# Patient Record
Sex: Female | Born: 1990 | Hispanic: Yes | Marital: Married | Smoking: Never smoker
Health system: Southern US, Community
[De-identification: ages and names within clinical notes are randomized; demographics above are authoritative.]

## PROBLEM LIST (undated history)

## (undated) ENCOUNTER — Inpatient Hospital Stay (HOSPITAL_COMMUNITY): Payer: Self-pay

## (undated) DIAGNOSIS — Z789 Other specified health status: Secondary | ICD-10-CM

---
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
## 2011-01-08 ENCOUNTER — Other Ambulatory Visit: Payer: Self-pay | Admitting: Family Medicine

## 2011-01-08 DIAGNOSIS — Z3689 Encounter for other specified antenatal screening: Secondary | ICD-10-CM

## 2011-01-08 LAB — ABO/RH: RH Type: POSITIVE

## 2011-01-08 LAB — HEPATITIS B SURFACE ANTIGEN: Hepatitis B Surface Ag: NEGATIVE

## 2011-01-08 LAB — HIV ANTIBODY (ROUTINE TESTING W REFLEX): HIV: NONREACTIVE

## 2011-02-04 ENCOUNTER — Ambulatory Visit (HOSPITAL_COMMUNITY)
Admission: RE | Admit: 2011-02-04 | Discharge: 2011-02-04 | Disposition: A | Discharge status: Home / Self Care | Payer: Medicaid Other | Source: Ambulatory Visit | Attending: Family Medicine | Admitting: Family Medicine

## 2011-02-04 DIAGNOSIS — Z3689 Encounter for other specified antenatal screening: Secondary | ICD-10-CM

## 2011-02-04 DIAGNOSIS — Z363 Encounter for antenatal screening for malformations: Secondary | ICD-10-CM | POA: Insufficient documentation

## 2011-02-04 DIAGNOSIS — O358XX0 Maternal care for other (suspected) fetal abnormality and damage, not applicable or unspecified: Secondary | ICD-10-CM | POA: Insufficient documentation

## 2011-02-04 DIAGNOSIS — Z1389 Encounter for screening for other disorder: Secondary | ICD-10-CM | POA: Insufficient documentation

## 2011-02-05 ENCOUNTER — Other Ambulatory Visit: Payer: Self-pay | Admitting: Family Medicine

## 2011-02-05 DIAGNOSIS — Z3689 Encounter for other specified antenatal screening: Secondary | ICD-10-CM

## 2011-02-18 ENCOUNTER — Ambulatory Visit (HOSPITAL_COMMUNITY)
Admission: RE | Admit: 2011-02-18 | Discharge: 2011-02-18 | Disposition: A | Discharge status: Home / Self Care | Payer: Medicaid Other | Source: Ambulatory Visit | Attending: Family Medicine | Admitting: Family Medicine

## 2011-02-18 DIAGNOSIS — Z3689 Encounter for other specified antenatal screening: Secondary | ICD-10-CM | POA: Insufficient documentation

## 2011-04-28 ENCOUNTER — Other Ambulatory Visit (HOSPITAL_COMMUNITY): Payer: Self-pay | Admitting: Physician Assistant

## 2011-04-28 ENCOUNTER — Ambulatory Visit (HOSPITAL_COMMUNITY)
Admission: RE | Admit: 2011-04-28 | Discharge: 2011-04-28 | Disposition: A | Discharge status: Home / Self Care | Payer: Medicaid Other | Source: Ambulatory Visit | Attending: Physician Assistant | Admitting: Physician Assistant

## 2011-04-28 DIAGNOSIS — O368190 Decreased fetal movements, unspecified trimester, not applicable or unspecified: Secondary | ICD-10-CM

## 2011-04-28 DIAGNOSIS — O365990 Maternal care for other known or suspected poor fetal growth, unspecified trimester, not applicable or unspecified: Secondary | ICD-10-CM | POA: Insufficient documentation

## 2011-04-28 DIAGNOSIS — Z3689 Encounter for other specified antenatal screening: Secondary | ICD-10-CM | POA: Insufficient documentation

## 2011-07-07 ENCOUNTER — Other Ambulatory Visit (HOSPITAL_COMMUNITY): Payer: Self-pay | Admitting: Nurse Practitioner

## 2011-07-07 DIAGNOSIS — O480 Post-term pregnancy: Secondary | ICD-10-CM

## 2011-07-09 ENCOUNTER — Telehealth (HOSPITAL_COMMUNITY): Payer: Self-pay | Admitting: *Deleted

## 2011-07-09 ENCOUNTER — Encounter (HOSPITAL_COMMUNITY): Payer: Self-pay | Admitting: *Deleted

## 2011-07-09 NOTE — Telephone Encounter (Signed)
Preadmission screen  

## 2011-07-10 ENCOUNTER — Other Ambulatory Visit (HOSPITAL_COMMUNITY): Payer: Self-pay | Admitting: Nurse Practitioner

## 2011-07-10 ENCOUNTER — Inpatient Hospital Stay (HOSPITAL_COMMUNITY)
Admission: AD | Admit: 2011-07-10 | Discharge: 2011-07-15 | DRG: 766 | Disposition: A | Discharge status: Home / Self Care | Payer: Medicaid Other | Attending: Obstetrics & Gynecology | Admitting: Obstetrics & Gynecology

## 2011-07-10 ENCOUNTER — Encounter (HOSPITAL_COMMUNITY): Payer: Self-pay | Admitting: *Deleted

## 2011-07-10 ENCOUNTER — Ambulatory Visit (HOSPITAL_COMMUNITY)
Admission: RE | Admit: 2011-07-10 | Discharge: 2011-07-10 | Disposition: A | Discharge status: Home / Self Care | Payer: Self-pay | Source: Ambulatory Visit | Attending: Nurse Practitioner | Admitting: Nurse Practitioner

## 2011-07-10 DIAGNOSIS — Z98891 History of uterine scar from previous surgery: Secondary | ICD-10-CM

## 2011-07-10 DIAGNOSIS — O480 Post-term pregnancy: Secondary | ICD-10-CM

## 2011-07-10 DIAGNOSIS — O324XX0 Maternal care for high head at term, not applicable or unspecified: Secondary | ICD-10-CM | POA: Diagnosis present

## 2011-07-10 DIAGNOSIS — Z3689 Encounter for other specified antenatal screening: Secondary | ICD-10-CM | POA: Insufficient documentation

## 2011-07-10 NOTE — MAU Note (Signed)
Contractions every 5-6 minutes since 9pm. No vaginal bleeding. Good fetal movement

## 2011-07-11 ENCOUNTER — Encounter (HOSPITAL_COMMUNITY): Payer: Self-pay | Admitting: *Deleted

## 2011-07-11 LAB — CBC
HCT: 37.6 % (ref 36.0–46.0)
Hemoglobin: 12.5 g/dL (ref 12.0–15.0)
MCH: 29.8 pg (ref 26.0–34.0)
MCV: 89.5 fL (ref 78.0–100.0)
Platelets: 174 10*3/uL (ref 150–400)
RBC: 4.2 MIL/uL (ref 3.87–5.11)
WBC: 12.8 10*3/uL — ABNORMAL HIGH (ref 4.0–10.5)

## 2011-07-11 MED ORDER — OXYCODONE-ACETAMINOPHEN 5-325 MG PO TABS: 1.0000 | ORAL_TABLET | ORAL | Status: DC | PRN

## 2011-07-11 MED ORDER — DIPHENHYDRAMINE HCL 50 MG/ML IJ SOLN
12.5000 mg | INTRAMUSCULAR | Status: DC | PRN
  Filled 2011-07-11: qty 1

## 2011-07-11 MED ORDER — FENTANYL 2.5 MCG/ML BUPIVACAINE 1/10 % EPIDURAL INFUSION (WH - ANES)
14.0000 mL/h | INTRAMUSCULAR | Status: DC
  Administered 2011-07-11 (×5): 14 mL/h via EPIDURAL
  Filled 2011-07-11 (×6): qty 60

## 2011-07-11 MED ORDER — OXYTOCIN BOLUS FROM INFUSION
500.0000 mL | Freq: Once | INTRAVENOUS | Status: DC
  Filled 2011-07-11: qty 500

## 2011-07-11 MED ORDER — LIDOCAINE HCL (PF) 1 % IJ SOLN
30.0000 mL | INTRAMUSCULAR | Status: DC | PRN
  Filled 2011-07-11: qty 30

## 2011-07-11 MED ORDER — OXYTOCIN 20 UNITS IN LACTATED RINGERS INFUSION - SIMPLE
1.0000 m[IU]/min | INTRAVENOUS | Status: DC
  Administered 2011-07-11: 2 m[IU]/min via INTRAVENOUS
  Filled 2011-07-11: qty 1000

## 2011-07-11 MED ORDER — LACTATED RINGERS IV SOLN
INTRAVENOUS | Status: DC
  Administered 2011-07-11: 125 mL/h via INTRAVENOUS
  Administered 2011-07-11 (×3): via INTRAVENOUS

## 2011-07-11 MED ORDER — PHENYLEPHRINE 40 MCG/ML (10ML) SYRINGE FOR IV PUSH (FOR BLOOD PRESSURE SUPPORT)
80.0000 ug | PREFILLED_SYRINGE | INTRAVENOUS | Status: DC | PRN
  Filled 2011-07-11 (×2): qty 5

## 2011-07-11 MED ORDER — IBUPROFEN 600 MG PO TABS: 600.0000 mg | ORAL_TABLET | Freq: Four times a day (QID) | ORAL | Status: DC | PRN

## 2011-07-11 MED ORDER — SODIUM BICARBONATE 8.4 % IV SOLN
INTRAVENOUS | Status: DC | PRN
  Administered 2011-07-11: 4 mL via EPIDURAL

## 2011-07-11 MED ORDER — PHENYLEPHRINE 40 MCG/ML (10ML) SYRINGE FOR IV PUSH (FOR BLOOD PRESSURE SUPPORT): 80.0000 ug | PREFILLED_SYRINGE | INTRAVENOUS | Status: DC | PRN

## 2011-07-11 MED ORDER — LACTATED RINGERS IV SOLN
500.0000 mL | INTRAVENOUS | Status: DC | PRN
  Administered 2011-07-11 (×2): 500 mL via INTRAVENOUS

## 2011-07-11 MED ORDER — CITRIC ACID-SODIUM CITRATE 334-500 MG/5ML PO SOLN
30.0000 mL | ORAL | Status: DC | PRN
  Administered 2011-07-12: 30 mL via ORAL
  Filled 2011-07-11: qty 15

## 2011-07-11 MED ORDER — NALBUPHINE SYRINGE 5 MG/0.5 ML: 5.0000 mg | INJECTION | INTRAMUSCULAR | Status: DC | PRN

## 2011-07-11 MED ORDER — EPHEDRINE 5 MG/ML INJ: 10.0000 mg | INTRAVENOUS | Status: DC | PRN

## 2011-07-11 MED ORDER — LACTATED RINGERS IV SOLN: 500.0000 mL | Freq: Once | INTRAVENOUS | Status: DC

## 2011-07-11 MED ORDER — TERBUTALINE SULFATE 1 MG/ML IJ SOLN: 0.2500 mg | Freq: Once | INTRAMUSCULAR | Status: AC | PRN

## 2011-07-11 MED ORDER — FENTANYL 2.5 MCG/ML BUPIVACAINE 1/10 % EPIDURAL INFUSION (WH - ANES)
INTRAMUSCULAR | Status: DC | PRN
  Administered 2011-07-11: 12 mL/h via EPIDURAL

## 2011-07-11 MED ORDER — ONDANSETRON HCL 4 MG/2ML IJ SOLN: 4.0000 mg | Freq: Four times a day (QID) | INTRAMUSCULAR | Status: DC | PRN

## 2011-07-11 MED ORDER — OXYTOCIN 20 UNITS IN LACTATED RINGERS INFUSION - SIMPLE: 125.0000 mL/h | Freq: Once | INTRAVENOUS | Status: DC

## 2011-07-11 MED ORDER — FLEET ENEMA 7-19 GM/118ML RE ENEM: 1.0000 | ENEMA | RECTAL | Status: DC | PRN

## 2011-07-11 MED ORDER — ACETAMINOPHEN 325 MG PO TABS: 650.0000 mg | ORAL_TABLET | ORAL | Status: DC | PRN

## 2011-07-11 MED ORDER — EPHEDRINE 5 MG/ML INJ
10.0000 mg | INTRAVENOUS | Status: DC | PRN
  Filled 2011-07-11 (×2): qty 4

## 2011-07-11 NOTE — MAU Provider Note (Signed)
  History     Chief Complaint  Patient presents with  . Contractions   HPI Patient is a 21 yo G1 at [redacted]w[redacted]d presenting for contractions that started 3 hours ago. She has not had any LOF. Reports good fetal movement. Some bloody show noted. [facility] States no complications during this pregnancy.  OB History    Grav Para Term Preterm Abortions TAB SAB Ect Mult Living   1               Past Medical History  Diagnosis Date  . No pertinent past medical history     Past Surgical History  Procedure Date  . No past surgeries     History reviewed. No pertinent family history.  History  Substance Use Topics  . Smoking status: Never Smoker   . Smokeless tobacco: Never Used  . Alcohol Use: No    Allergies: No Known Allergies  Prescriptions prior to admission  Medication Sig Dispense Refill  . prenatal vitamin w/FE, FA (PRENATAL 1 + 1) 27-1 MG TABS Take 1 tablet by mouth daily.          Review of Systems  Constitutional: Negative for fever and chills.  Respiratory: Negative for shortness of breath.   Cardiovascular: Negative for chest pain.  Gastrointestinal: Negative for nausea and vomiting.  Genitourinary: Negative for dysuria.  Skin: Negative for rash.  Neurological: Negative for dizziness and headaches.   Physical Exam   Blood pressure 120/81, pulse 83, temperature 97.9 F (36.6 C), temperature source Oral, resp. rate 16, height 5' 3.5" (1.613 m), weight 84.823 kg (187 lb), last menstrual period 09/30/2010.  Physical Exam  Constitutional: She is oriented to person, place, and time. She appears well-developed and well-nourished. No distress.  HENT:  Head: Normocephalic and atraumatic.  Neck: Normal range of motion.  Cardiovascular: Normal rate and regular rhythm.   No murmur heard. Respiratory: Effort normal and breath sounds normal. She has no wheezes.  GI: Soft.       Gravid. Toco in place.  Musculoskeletal: Normal range of  motion. She exhibits no edema and no tenderness.  Neurological: She is alert and oriented to person, place, and time. No cranial nerve deficit.  Skin: Skin is dry. No rash noted.    MAU Course  Procedures Dilation: 3.5 Effacement (%): 60 Station: -2 Presentation: Vertex Exam by:: L. Munford RN  MDM Irregular contracting every 4-6 min. FHT reassuring with a baseline in the 140's, moderate variability. Patient dilated to 3.5. Patient states she would like to walk for one hour and be rechecked.  Recheck  Dilation: 4.5 Effacement (%): 60 Station: -2 Presentation: Vertex Exam by:: L. Munford RN  Assessment and Plan  21 yo G1P0 at [redacted]w[redacted]d presenting for contractions - Patient with cervical change after observation. Will admit to L&D. Plan discussed with Maylon Cos CNM who agrees with plan.  Ki Luckman 07/11/2011, 12:02 AM

## 2011-07-11 NOTE — Progress Notes (Signed)
Leslie Jackson is a 21 y.o. G1P0000 at [redacted]w[redacted]d by LMP admitted for active labor  Subjective: Patient is doing well.  She c/o itchiness and tingling in legs after epidural.  She feels slightly drowsy now.  She is not feeling contractions and denies HA and visual changes.  Objective: BP 97/63  Pulse 93  Temp(Src) 98.2 F (36.8 C) (Oral)  Resp 20  Ht 5\' 4"  (1.626 m)  Wt 82.555 kg (182 lb)  BMI 31.24 kg/m2  SpO2 98%  LMP 09/30/2010      FHT:  FHR: 130 bpm, variability: moderate,  accelerations:  Present,  decelerations:  Absent variables UC:   regular, every 2-3 minutes SVE:   Dilation: 6 Effacement (%): 70;80 Station: -2 Exam by:: Lorretta Harp RNC  Labs: Lab Results  Component Value Date   WBC 12.8* 07/11/2011   HGB 12.5 07/11/2011   HCT 37.6 07/11/2011   MCV 89.5 07/11/2011   PLT 174 07/11/2011    Assessment / Plan: Spontaneous labor, progressing normally  Labor: Progressing on Pitocin Preeclampsia:  no signs or symptoms of toxicity Fetal Wellbeing:  Category I Pain Control:  Epidural I/D:  n/a Anticipated MOD:  NSVD  Mardene Speak 07/11/2011, 12:12 PM  Patient seen and evaluated with PA student. Will consider IUPC at next check  Fawn Kirk 07/11/2011, 12:57PM

## 2011-07-11 NOTE — H&P (Signed)
Leslie Jackson is a 21 y.o. female G1P0 at [redacted]w[redacted]d presenting for active labor. Patient was seen in MAU. Cervix dilated to 3.5 cm on admission. Patient observed for one hour, dilated to 4.5 therefore admitted to L&D. Patient deines LOF or vaginal bleeding. Good fetal movement. Prenatal care at [facility] Department. No complications of pregnancy. Interested in breast feeding. Pediatrician will be [facility]. Patient states she would like Depo for PP contraception, although prenatal record states she is interested in IUD.  Maternal Medical History:  Reason for admission: Reason for admission: contractions.  Reason for Admission:   nauseaContractions: Onset was 3-5 hours ago.   Frequency: regular.    Fetal activity: Perceived fetal activity is normal.      OB History    Grav Para Term Preterm Abortions TAB SAB Ect Mult Living   1              Past Medical History  Diagnosis Date  . No pertinent past medical history    Past Surgical History  Procedure Date  . No past surgeries    Family History: family history is not on file. Social History:  reports that she has never smoked. She has never used smokeless tobacco. She reports that she does not drink alcohol or use illicit drugs.  Review of Systems  Constitutional: Negative for fever and chills.  Respiratory: Negative for shortness of breath.   Cardiovascular: Negative for chest pain.  Gastrointestinal: Positive for abdominal pain. Negative for nausea and vomiting.  Genitourinary: Negative for dysuria.  Musculoskeletal: Positive for back pain.  Skin: Negative for rash.  Neurological: Negative for dizziness and headaches.   Dilation: 4.5 Effacement (%): 60 Station: -2 Exam by:: L. Munford RN Blood pressure 120/81, pulse 83, temperature 97.9 F (36.6 C), temperature source Oral, resp. rate 16, height 5' 3.5" (1.613 m), weight 84.823 kg (187 lb), last menstrual period 09/30/2010. Exam Physical Exam  Constitutional: She is oriented to  person, place, and time. She appears well-developed and well-nourished. No distress.       Comfortable  HENT:  Head: Normocephalic and atraumatic.  Neck: Normal range of motion.  Cardiovascular: Normal rate and regular rhythm.   No murmur heard. Respiratory: Effort normal and breath sounds normal. She has no wheezes.  GI: Soft.       Gravid. Toco in place.  Musculoskeletal: Normal range of motion. She exhibits no edema and no tenderness.  Neurological: She is alert and oriented to person, place, and time. No cranial nerve deficit.  Skin: Skin is dry. No rash noted.    Prenatal labs: ABO, Rh: O/Positive/-- (09/12 0000) Antibody: Negative (09/12 0000) Rubella: Immune (09/12 0000) RPR: Nonreactive (09/12 0000)  HBsAg: Negative (09/12 0000)  HIV: Non-reactive (09/12 0000)  GBS: Negative (02/12 0000)   Assessment/Plan: 21 yo G1 at [redacted]w[redacted]d admitted for active labor - Admit to L&D; attending Dr. Macon Large - Patient would like IV pain medication and epidural when appropriate - Continue to monitor; expectant management - Plan discussed with Maylon Cos, CNM   Jayin Derousse 07/11/2011, 2:09 AM

## 2011-07-11 NOTE — Progress Notes (Signed)
Leslie Jackson is a 21 y.o. G1P0000 at [redacted]w[redacted]d  Subjective: Patient reporting increased pain with contractions. No pelvic pressure  Objective: BP 124/81  Pulse 110  Temp(Src) 98.4 F (36.9 C) (Oral)  Resp 20  Ht 5\' 4"  (1.626 m)  Wt 82.555 kg (182 lb)  BMI 31.24 kg/m2  SpO2 98%  LMP 09/30/2010 I/O last 3 completed shifts: In: -  Out: 1250 [Urine:1250]    FHT:  FHR: 145 bpm, variability: moderate,  accelerations:  Present,  decelerations:  Absent UC:   regular, every 3-4 minutes SVE:   Dilation: 8.5 Effacement (%): 90 Station: 0 Exam by:: Everrett Coombe RN  Labs: Lab Results  Component Value Date   WBC 12.8* 07/11/2011   HGB 12.5 07/11/2011   HCT 37.6 07/11/2011   MCV 89.5 07/11/2011   PLT 174 07/11/2011    Assessment / Plan: Augmentation of labor, progressing well  Labor: Progressing on Pitocin Preeclampsia:  N/A Fetal Wellbeing:  Category I Pain Control:  Epidural; will rebolus now I/D:  n/a Anticipated MOD:  NSVD  Ahlayah Tarkowski 07/11/2011, 10:46 PM

## 2011-07-11 NOTE — H&P (Signed)
Attestation of Attending Supervision of Resident: Evaluation and management procedures were performed by the Family Medicine Resident under my supervision.  I have reviewed the resident's note and chart, and I agree with management and plan.  Tisa Weisel, M.D. 07/11/2011 8:11 AM    

## 2011-07-11 NOTE — Anesthesia Preprocedure Evaluation (Signed)

## 2011-07-11 NOTE — Anesthesia Procedure Notes (Signed)

## 2011-07-11 NOTE — Progress Notes (Addendum)
Subjective: Patient comfortable with epidural. Not feeling any contractions. No changes in pressure.  Objective: BP 99/59  Pulse 92  Temp(Src) 97.8 F (36.6 C) (Oral)  Resp 18  Ht 5\' 4"  (1.626 m)  Wt 82.555 kg (182 lb)  BMI 31.24 kg/m2  SpO2 98%  LMP 09/30/2010      FHT:  FHR: 135 bpm, variability: moderate,  accelerations:  Present,  decelerations:  none UC:   regular, every 4-5 minutes SVE:   Dilation: 6.5 Effacement (%): 70 Station: -2 Exam by:: Lorretta Harp RNC  Labs: Lab Results  Component Value Date   WBC 12.8* 07/11/2011   HGB 12.5 07/11/2011   HCT 37.6 07/11/2011   MCV 89.5 07/11/2011   PLT 174 07/11/2011    Assessment / Plan: Spontaneous labor, progressing normally  Labor: Progressing normally Preeclampsia:  n/a Fetal Wellbeing:  Category I Pain Control:  Epidural I/D:  n/a Anticipated MOD:  NSVD  Ala Dach 07/11/2011, 9:54 AM

## 2011-07-11 NOTE — Progress Notes (Signed)
Leslie Jackson is a 21 y.o. G1P0 at [redacted]w[redacted]d   Subjective: Increasing discomfort with contractions  Objective: BP 97/52  Pulse 86  Temp(Src) 97.8 F (36.6 C) (Oral)  Resp 20  Ht 5\' 4"  (1.626 m)  Wt 182 lb (82.555 kg)  BMI 31.24 kg/m2  LMP 09/30/2010      FHT:  FHR: 130 bpm, variability: moderate,  accelerations:  Present,  decelerations:  Absent UC:   regular, every 3-4 minutes SVE:   Dilation: 5 Effacement (%): 60 Station: -1 Exam by:: Marcellene Shivley, CNM   Labs: Lab Results  Component Value Date   WBC 12.8* 07/11/2011   HGB 12.5 07/11/2011   HCT 37.6 07/11/2011   MCV 89.5 07/11/2011   PLT 174 07/11/2011    Assessment / Plan: Spontaneous labor, progressing normally  Labor: Progressing normally, plan AROM after epidural Preeclampsia:  n/a Fetal Wellbeing:  Category I Pain Control:  Labor support without medications, desires epidural I/D:  n/a Anticipated MOD:  NSVD  Dirk Vanaman E. 07/11/2011, 5:21 AM

## 2011-07-11 NOTE — Progress Notes (Signed)
Dr. Adrian Blackwater notified of pts pain level and SVE exam. Dr. Adrian Blackwater said pt could have an epidural redose.

## 2011-07-11 NOTE — Progress Notes (Signed)
Leslie Jackson is a 21 y.o. G1P0 at [redacted]w[redacted]d  Subjective: Patient doing well. Epidural in place.  Objective: BP 105/48  Pulse 87  Temp(Src) 97.8 F (36.6 C) (Oral)  Resp 18  Ht 5\' 4"  (1.626 m)  Wt 82.555 kg (182 lb)  BMI 31.24 kg/m2  SpO2 98%  LMP 09/30/2010     FHT:  FHR: 140 bpm, variability: moderate,  accelerations:  Present,  decelerations:  Absent UC:   regular, every 4-6 minutes SVE:   Dilation: 5 Effacement (%): 60 Station: -1 Exam by:: [place], CNM   Labs: Lab Results  Component Value Date   WBC 12.8* 07/11/2011   HGB 12.5 07/11/2011   HCT 37.6 07/11/2011   MCV 89.5 07/11/2011   PLT 174 07/11/2011    Assessment / Plan: Spontaneous labor, progressing normally  Labor: Progressing normally and AROM at 0745. Moderate meconium Preeclampsia:  N/A Fetal Wellbeing:  Category I Pain Control:  Epidural I/D:  n/a Anticipated MOD:  NSVD  Simmie Camerer 07/11/2011, 7:50 AM

## 2011-07-11 NOTE — MAU Note (Signed)
Monitors removed to ambulate in the hall. 

## 2011-07-11 NOTE — MAU Provider Note (Signed)
Attestation of Attending Supervision of Resident: Evaluation and management procedures were performed by the [facility] Medicine Resident under my supervision.  I have reviewed the resident's note and chart, and I agree with management and plan.  Jaynie Collins, M.D. 07/11/2011 8:11 AM

## 2011-07-11 NOTE — Progress Notes (Signed)
Subjective: Patient comfortable with epidural  Objective: BP 125/74  Pulse 103  Temp(Src) 98.4 F (36.9 C) (Oral)  Resp 18  Ht 5\' 4"  (1.626 m)  Wt 82.555 kg (182 lb)  BMI 31.24 kg/m2  SpO2 98%  LMP 09/30/2010 I/O last 3 completed shifts: In: -  Out: 1250 [Urine:1250]    FHT:  FHR: 140s bpm, variability: moderate,  accelerations:  Abscent,  decelerations:  Absent UC:   regular, every 2-3 minutes SVE:   Dilation: Lip/rim Effacement (%): 90 Station: 0 Exam by:: Dr. Adrian Blackwater  Labs: Lab Results  Component Value Date   WBC 12.8* 07/11/2011   HGB 12.5 07/11/2011   HCT 37.6 07/11/2011   MCV 89.5 07/11/2011   PLT 174 07/11/2011    Assessment / Plan: MVUs still not adequate.  Will continue to titrate pitocin.  Leslie Jackson JEHIEL 07/11/2011, 11:33 PM

## 2011-07-11 NOTE — Progress Notes (Signed)
Subjective: Comfortable with epidural. Nausea. Feeling some increased pressure.  Objective: BP 108/72  Pulse 108  Temp(Src) 97.7 F (36.5 C) (Oral)  Resp 20  Ht 5\' 4"  (1.626 m)  Wt 82.555 kg (182 lb)  BMI 31.24 kg/m2  SpO2 98%  LMP 09/30/2010   Total I/O In: -  Out: 800 [Urine:800]  FHT:  FHR: 135 bpm, variability: moderate,  accelerations:  Present,  decelerations:  Absent UC:  Regular, every 3-5 minutes SVE:   Dilation: 6.5 Effacement (%): 90 Station: -2 Exam by:: Dr. Leonor Liv D. [facility]  Labs: Lab Results  Component Value Date   WBC 12.8* 07/11/2011   HGB 12.5 07/11/2011   HCT 37.6 07/11/2011   MCV 89.5 07/11/2011   PLT 174 07/11/2011    Assessment / Plan: Augmentation of labor, progressing well  IUPC inserted. Restarting Pitocin  Labor: Progressing normally Preeclampsia:  n/a Fetal Wellbeing:  Category I Pain Control:  Epidural I/D:  n/a Anticipated MOD:  NSVD  Ala Dach 07/11/2011, 2:52 PM

## 2011-07-11 NOTE — Progress Notes (Signed)
Subjective: Comfortable with epidural. Feeling increased pressure. Not feeling contractions. No headache.  Objective: BP 113/60  Pulse 126  Temp(Src) 97.5 F (36.4 C) (Oral)  Resp 20  Ht 5\' 4"  (1.626 m)  Wt 82.555 kg (182 lb)  BMI 31.24 kg/m2  SpO2 98%  LMP 09/30/2010   Total I/O In: -  Out: 800 [Urine:800]  FHT:  FHR: 135 bpm, variability: moderate,  accelerations:  Present,  decelerations:  Absent UC:   regular, every 2 minutes SVE:   Dilation: 8.5 Effacement (%): 90 Station: -2 Exam by:: D. brown RNC  Labs: Lab Results  Component Value Date   WBC 12.8* 07/11/2011   HGB 12.5 07/11/2011   HCT 37.6 07/11/2011   MCV 89.5 07/11/2011   PLT 174 07/11/2011    Assessment / Plan: Augmentation of labor, progressing well  Labor: Progressing normally Preeclampsia:  n/a Fetal Wellbeing:  Category I Pain Control:  Epidural I/D:  n/a Anticipated MOD:  NSVD  Ala Dach 07/11/2011, 6:19 PM

## 2011-07-12 ENCOUNTER — Encounter (HOSPITAL_COMMUNITY): Payer: Self-pay | Admitting: Family Medicine

## 2011-07-12 ENCOUNTER — Inpatient Hospital Stay (HOSPITAL_COMMUNITY): Payer: Medicaid Other | Admitting: Anesthesiology

## 2011-07-12 ENCOUNTER — Encounter (HOSPITAL_COMMUNITY): Payer: Self-pay | Admitting: Anesthesiology

## 2011-07-12 ENCOUNTER — Encounter (HOSPITAL_COMMUNITY)
Admission: AD | Disposition: A | Discharge status: Home / Self Care | Payer: Self-pay | Source: Home / Self Care | Attending: Obstetrics & Gynecology

## 2011-07-12 DIAGNOSIS — O324XX0 Maternal care for high head at term, not applicable or unspecified: Secondary | ICD-10-CM

## 2011-07-12 LAB — ABO/RH: ABO/RH(D): O POS

## 2011-07-12 SURGERY — Surgical Case
Anesthesia: Regional | Site: Abdomen | Wound class: Clean Contaminated

## 2011-07-12 MED ORDER — OXYTOCIN 20 UNITS IN LACTATED RINGERS INFUSION - SIMPLE: 125.0000 mL/h | INTRAVENOUS | Status: AC

## 2011-07-12 MED ORDER — OXYTOCIN 20 UNITS IN LACTATED RINGERS INFUSION - SIMPLE: 1.0000 m[IU]/min | INTRAVENOUS | Status: DC

## 2011-07-12 MED ORDER — SIMETHICONE 80 MG PO CHEW
80.0000 mg | CHEWABLE_TABLET | Freq: Three times a day (TID) | ORAL | Status: DC
  Administered 2011-07-12 – 2011-07-15 (×10): 80 mg via ORAL

## 2011-07-12 MED ORDER — SCOPOLAMINE 1 MG/3DAYS TD PT72
1.0000 | MEDICATED_PATCH | Freq: Once | TRANSDERMAL | Status: AC
  Administered 2011-07-12: 1.5 mg via TRANSDERMAL

## 2011-07-12 MED ORDER — OXYTOCIN 20 UNITS IN LACTATED RINGERS INFUSION - SIMPLE
INTRAVENOUS | Status: DC | PRN
  Administered 2011-07-12 (×2): 20 [IU] via INTRAVENOUS

## 2011-07-12 MED ORDER — WITCH HAZEL-GLYCERIN EX PADS: 1.0000 "application " | MEDICATED_PAD | CUTANEOUS | Status: DC | PRN

## 2011-07-12 MED ORDER — MENTHOL 3 MG MT LOZG: 1.0000 | LOZENGE | OROMUCOSAL | Status: DC | PRN

## 2011-07-12 MED ORDER — SCOPOLAMINE 1 MG/3DAYS TD PT72
MEDICATED_PATCH | TRANSDERMAL | Status: AC
  Filled 2011-07-12: qty 1

## 2011-07-12 MED ORDER — LACTATED RINGERS IV SOLN: INTRAVENOUS | Status: DC

## 2011-07-12 MED ORDER — IBUPROFEN 600 MG PO TABS
600.0000 mg | ORAL_TABLET | Freq: Four times a day (QID) | ORAL | Status: DC
  Administered 2011-07-12 – 2011-07-15 (×11): 600 mg via ORAL
  Filled 2011-07-12 (×6): qty 1

## 2011-07-12 MED ORDER — METHYLERGONOVINE MALEATE 0.2 MG/ML IJ SOLN
INTRAMUSCULAR | Status: DC | PRN
  Administered 2011-07-12: 0.2 mg via INTRAMUSCULAR

## 2011-07-12 MED ORDER — MORPHINE SULFATE (PF) 0.5 MG/ML IJ SOLN
INTRAMUSCULAR | Status: DC | PRN
  Administered 2011-07-12: 4 mg via EPIDURAL

## 2011-07-12 MED ORDER — LACTATED RINGERS IV SOLN
INTRAVENOUS | Status: DC
  Administered 2011-07-12 (×2): via INTRAVENOUS

## 2011-07-12 MED ORDER — ONDANSETRON HCL 4 MG/2ML IJ SOLN
INTRAMUSCULAR | Status: DC | PRN
  Administered 2011-07-12: 4 mg via INTRAVENOUS

## 2011-07-12 MED ORDER — KETOROLAC TROMETHAMINE 60 MG/2ML IM SOLN
60.0000 mg | Freq: Once | INTRAMUSCULAR | Status: AC | PRN
  Administered 2011-07-12: 60 mg via INTRAMUSCULAR

## 2011-07-12 MED ORDER — DIPHENHYDRAMINE HCL 25 MG PO CAPS: 25.0000 mg | ORAL_CAPSULE | ORAL | Status: DC | PRN

## 2011-07-12 MED ORDER — KETOROLAC TROMETHAMINE 30 MG/ML IJ SOLN
30.0000 mg | Freq: Four times a day (QID) | INTRAMUSCULAR | Status: AC | PRN
  Administered 2011-07-12: 30 mg via INTRAVENOUS
  Filled 2011-07-12: qty 1

## 2011-07-12 MED ORDER — ONDANSETRON HCL 4 MG/2ML IJ SOLN: 4.0000 mg | Freq: Three times a day (TID) | INTRAMUSCULAR | Status: DC | PRN

## 2011-07-12 MED ORDER — CEFAZOLIN SODIUM 1-5 GM-% IV SOLN
INTRAVENOUS | Status: DC | PRN
  Administered 2011-07-12: 1 g via INTRAVENOUS

## 2011-07-12 MED ORDER — ONDANSETRON HCL 4 MG/2ML IJ SOLN
INTRAMUSCULAR | Status: AC
  Filled 2011-07-12: qty 2

## 2011-07-12 MED ORDER — DIPHENHYDRAMINE HCL 50 MG/ML IJ SOLN: 12.5000 mg | INTRAMUSCULAR | Status: DC | PRN

## 2011-07-12 MED ORDER — METHYLERGONOVINE MALEATE 0.2 MG/ML IJ SOLN
INTRAMUSCULAR | Status: AC
  Filled 2011-07-12: qty 1

## 2011-07-12 MED ORDER — KETOROLAC TROMETHAMINE 30 MG/ML IJ SOLN: 30.0000 mg | Freq: Four times a day (QID) | INTRAMUSCULAR | Status: AC | PRN

## 2011-07-12 MED ORDER — MEPERIDINE HCL 25 MG/ML IJ SOLN: 6.2500 mg | INTRAMUSCULAR | Status: DC | PRN

## 2011-07-12 MED ORDER — SODIUM CHLORIDE 0.9 % IV SOLN
1.0000 ug/kg/h | INTRAVENOUS | Status: DC | PRN
  Filled 2011-07-12: qty 2.5

## 2011-07-12 MED ORDER — INFLUENZA VIRUS VACC SPLIT PF IM SUSP
0.5000 mL | INTRAMUSCULAR | Status: AC
  Filled 2011-07-12: qty 0.5

## 2011-07-12 MED ORDER — CEFAZOLIN SODIUM 1-5 GM-% IV SOLN: 1.0000 g | INTRAVENOUS | Status: DC

## 2011-07-12 MED ORDER — TETANUS-DIPHTH-ACELL PERTUSSIS 5-2.5-18.5 LF-MCG/0.5 IM SUSP: 0.5000 mL | Freq: Once | INTRAMUSCULAR | Status: DC

## 2011-07-12 MED ORDER — NALBUPHINE HCL 10 MG/ML IJ SOLN
5.0000 mg | INTRAMUSCULAR | Status: DC | PRN
  Filled 2011-07-12: qty 1

## 2011-07-12 MED ORDER — SIMETHICONE 80 MG PO CHEW: 80.0000 mg | CHEWABLE_TABLET | ORAL | Status: DC | PRN

## 2011-07-12 MED ORDER — SENNOSIDES-DOCUSATE SODIUM 8.6-50 MG PO TABS
2.0000 | ORAL_TABLET | Freq: Every day | ORAL | Status: DC
  Administered 2011-07-12 – 2011-07-14 (×3): 2 via ORAL

## 2011-07-12 MED ORDER — SODIUM BICARBONATE 8.4 % IV SOLN
INTRAVENOUS | Status: AC
  Filled 2011-07-12: qty 50

## 2011-07-12 MED ORDER — OXYTOCIN 10 UNIT/ML IJ SOLN
INTRAMUSCULAR | Status: AC
  Filled 2011-07-12: qty 4

## 2011-07-12 MED ORDER — ZOLPIDEM TARTRATE 5 MG PO TABS: 5.0000 mg | ORAL_TABLET | Freq: Every evening | ORAL | Status: DC | PRN

## 2011-07-12 MED ORDER — NALOXONE HCL 0.4 MG/ML IJ SOLN: 0.4000 mg | INTRAMUSCULAR | Status: DC | PRN

## 2011-07-12 MED ORDER — LACTATED RINGERS IV SOLN
INTRAVENOUS | Status: DC | PRN
  Administered 2011-07-12 (×2): via INTRAVENOUS

## 2011-07-12 MED ORDER — PRENATAL MULTIVITAMIN CH
1.0000 | ORAL_TABLET | Freq: Every day | ORAL | Status: DC
  Administered 2011-07-13 – 2011-07-15 (×3): 1 via ORAL
  Filled 2011-07-12 (×3): qty 1

## 2011-07-12 MED ORDER — METHYLERGONOVINE MALEATE 0.2 MG PO TABS: 0.2000 mg | ORAL_TABLET | ORAL | Status: DC

## 2011-07-12 MED ORDER — HYDROMORPHONE HCL PF 1 MG/ML IJ SOLN: 0.2500 mg | INTRAMUSCULAR | Status: DC | PRN

## 2011-07-12 MED ORDER — METHYLERGONOVINE MALEATE 0.2 MG PO TABS: 0.2000 mg | ORAL_TABLET | ORAL | Status: DC | PRN

## 2011-07-12 MED ORDER — PHENYLEPHRINE HCL 10 MG/ML IJ SOLN
INTRAMUSCULAR | Status: DC | PRN
  Administered 2011-07-12: 80 ug via INTRAVENOUS
  Administered 2011-07-12: 40 ug via INTRAVENOUS
  Administered 2011-07-12: 80 ug via INTRAVENOUS

## 2011-07-12 MED ORDER — LIDOCAINE-EPINEPHRINE (PF) 2 %-1:200000 IJ SOLN
INTRAMUSCULAR | Status: AC
  Filled 2011-07-12: qty 20

## 2011-07-12 MED ORDER — KETOROLAC TROMETHAMINE 60 MG/2ML IM SOLN
INTRAMUSCULAR | Status: AC
  Filled 2011-07-12: qty 2

## 2011-07-12 MED ORDER — METOCLOPRAMIDE HCL 5 MG/ML IJ SOLN: 10.0000 mg | Freq: Three times a day (TID) | INTRAMUSCULAR | Status: DC | PRN

## 2011-07-12 MED ORDER — OXYCODONE-ACETAMINOPHEN 5-325 MG PO TABS: 1.0000 | ORAL_TABLET | ORAL | Status: DC | PRN

## 2011-07-12 MED ORDER — FENTANYL CITRATE 0.05 MG/ML IJ SOLN
INTRAMUSCULAR | Status: AC
  Filled 2011-07-12: qty 2

## 2011-07-12 MED ORDER — DIBUCAINE 1 % RE OINT: 1.0000 "application " | TOPICAL_OINTMENT | RECTAL | Status: DC | PRN

## 2011-07-12 MED ORDER — ONDANSETRON HCL 4 MG PO TABS: 4.0000 mg | ORAL_TABLET | ORAL | Status: DC | PRN

## 2011-07-12 MED ORDER — CEFAZOLIN SODIUM 1-5 GM-% IV SOLN
INTRAVENOUS | Status: AC
  Filled 2011-07-12: qty 50

## 2011-07-12 MED ORDER — DIPHENHYDRAMINE HCL 50 MG/ML IJ SOLN: 25.0000 mg | INTRAMUSCULAR | Status: DC | PRN

## 2011-07-12 MED ORDER — ONDANSETRON HCL 4 MG/2ML IJ SOLN: 4.0000 mg | INTRAMUSCULAR | Status: DC | PRN

## 2011-07-12 MED ORDER — MORPHINE SULFATE (PF) 0.5 MG/ML IJ SOLN
INTRAMUSCULAR | Status: DC | PRN
  Administered 2011-07-12: 1 mg via INTRAVENOUS

## 2011-07-12 MED ORDER — DIPHENHYDRAMINE HCL 25 MG PO CAPS: 25.0000 mg | ORAL_CAPSULE | Freq: Four times a day (QID) | ORAL | Status: DC | PRN

## 2011-07-12 MED ORDER — MORPHINE SULFATE 0.5 MG/ML IJ SOLN
INTRAMUSCULAR | Status: AC
  Filled 2011-07-12: qty 10

## 2011-07-12 MED ORDER — IBUPROFEN 600 MG PO TABS
600.0000 mg | ORAL_TABLET | Freq: Four times a day (QID) | ORAL | Status: DC | PRN
  Filled 2011-07-12 (×5): qty 1

## 2011-07-12 MED ORDER — LACTATED RINGERS IV SOLN
INTRAVENOUS | Status: DC | PRN
  Administered 2011-07-12: 05:00:00 via INTRAVENOUS

## 2011-07-12 MED ORDER — SODIUM CHLORIDE 0.9 % IJ SOLN: 3.0000 mL | INTRAMUSCULAR | Status: DC | PRN

## 2011-07-12 MED ORDER — LANOLIN HYDROUS EX OINT: 1.0000 "application " | TOPICAL_OINTMENT | CUTANEOUS | Status: DC | PRN

## 2011-07-12 MED ORDER — FENTANYL CITRATE 0.05 MG/ML IJ SOLN
INTRAMUSCULAR | Status: DC | PRN
  Administered 2011-07-12: 100 ug via INTRAVENOUS

## 2011-07-12 SURGICAL SUPPLY — 29 items
CLOTH BEACON ORANGE TIMEOUT ST (SAFETY) ×2 IMPLANT
DERMABOND ADVANCED (GAUZE/BANDAGES/DRESSINGS) ×1
DERMABOND ADVANCED .7 DNX12 (GAUZE/BANDAGES/DRESSINGS) ×1 IMPLANT
DURAPREP 26ML APPLICATOR (WOUND CARE) ×4 IMPLANT
ELECT REM PT RETURN 9FT ADLT (ELECTROSURGICAL) ×2
ELECTRODE REM PT RTRN 9FT ADLT (ELECTROSURGICAL) ×1 IMPLANT
EXTRACTOR VACUUM BELL STYLE (SUCTIONS) IMPLANT
GLOVE BIOGEL PI IND STRL 8 (GLOVE) ×2 IMPLANT
GLOVE BIOGEL PI INDICATOR 8 (GLOVE) ×2
GLOVE ECLIPSE 8.0 STRL XLNG CF (GLOVE) ×2 IMPLANT
KIT ABG SYR 3ML LUER SLIP (SYRINGE) IMPLANT
NEEDLE HYPO 25X5/8 SAFETYGLIDE (NEEDLE) IMPLANT
NS IRRIG 1000ML POUR BTL (IV SOLUTION) ×2 IMPLANT
PACK C SECTION WH (CUSTOM PROCEDURE TRAY) ×2 IMPLANT
RTRCTR C-SECT PINK 25CM LRG (MISCELLANEOUS) IMPLANT
SLEEVE SCD COMPRESS KNEE MED (MISCELLANEOUS) IMPLANT
STAPLER VISISTAT 35W (STAPLE) IMPLANT
SUT CHROMIC 0 CT 1 (SUTURE) ×2 IMPLANT
SUT MNCRL 0 VIOLET CTX 36 (SUTURE) ×2 IMPLANT
SUT MONOCRYL 0 CTX 36 (SUTURE) ×2
SUT PLAIN 2 0 (SUTURE)
SUT PLAIN 2 0 XLH (SUTURE) IMPLANT
SUT PLAIN ABS 2-0 CT1 27XMFL (SUTURE) IMPLANT
SUT VIC AB 0 CTX 36 (SUTURE) ×1
SUT VIC AB 0 CTX36XBRD ANBCTRL (SUTURE) ×1 IMPLANT
SUT VIC AB 4-0 KS 27 (SUTURE) IMPLANT
TOWEL OR 17X24 6PK STRL BLUE (TOWEL DISPOSABLE) ×4 IMPLANT
TRAY FOLEY CATH 14FR (SET/KITS/TRAYS/PACK) IMPLANT
WATER STERILE IRR 1000ML POUR (IV SOLUTION) IMPLANT

## 2011-07-12 NOTE — Progress Notes (Signed)
Dr. Adrian Blackwater at the bedside and talked with pt about a caesarean section delivery.

## 2011-07-12 NOTE — OR Nursing (Addendum)
Fetal heart rate 146. When catheter secure strip was remove pt had a small area that looks open abrasion placenta to OR utility

## 2011-07-12 NOTE — Anesthesia Postprocedure Evaluation (Signed)
  Anesthesia Post-op Note  Patient: Leslie Jackson  Procedure(s) Performed: Procedure(s) (LRB): CESAREAN SECTION (N/A)  Patient Location: PACU and Mother/Baby  Anesthesia Type: Epidural  Level of Consciousness: awake, alert  and oriented  Airway and Oxygen Therapy: Patient Spontanous Breathing   Post-op Assessment: Patient's Cardiovascular Status Stable and Respiratory Function Stable  Post-op Vital Signs: stable  Complications: No apparent anesthesia complications

## 2011-07-12 NOTE — Op Note (Signed)
Preoperative diagnosis:  1.  Intrauterine pregnancy at 40 5/[redacted] weeks gestation                                         2.  Secondary arrest in second stage of labor                                         3.  ROP   Postoperative diagnosis:  Same as above   Procedure:  Primary cesarean section  Surgeon:  Lazaro Arms MD  Assistant:  Candelaria Celeste DO  Anesthesia: Spinal  Findings:  Patient had pushed for 2 1/2 hours with no descent, caput at +2 but head at 0 station.  Decision was to proceed with primary caesarean section  Over a low transverse incision was delivered a viable female with Apgars of 8 and 9 weighing  lbs.  oz. Uterus, tubes and ovaries were all normal.  There were no other significant findings.  Presentation was ROP.  Description of operation:  Patient was taken to the operating room and her epidural was dosed. She was then placed in the supine position with tilt to the left side. When adequate anesthetic level was obtained she was prepped and draped in usual sterile fashion and a Foley catheter was placed. A Pfannenstiel skin incision was made and carried down sharply to the rectus fascia which was scored in the midline extended laterally. The fascia was taken off the muscles both superiorly and without difficulty. The muscles were divided.  The peritoneal cavity was entered.  Bladder blade was placed, no bladder flap was created.  A low transverse hysterotomy incision was made and delivered a viable female  infant at 17 with Apgars of 8 and 9 weighing lbs  oz.  The uterus was exteriorized. It was closed in 2 layers, the first being a running interlocking layer and the second being an imbricating layer using 0 monocryl on a CTX needle. There was good resulting hemostasis. The uterus tubes and ovaries were all normal. Peritoneal cavity was irrigated vigorously. The muscles and peritoneum were reapproximated loosely. The fascia was closed using 0 Vicryl in running fashion. Subcutaneous  tissue was made hemostatic and irrigated. The skin was closed using 4-0 Vicryl on a Keith needle in a subcuticular fashion.  Dermabond was placed for additional wound integrity and to serve as a barrier. Blood loss for the procedure was 800 cc. The patient received a gram of Ancef prophylactically. The patient was taken to the recovery room in good stable condition with all counts being correct x3.  EBL 800 cc  Sherrill Buikema H 07/12/2011 4:54 AM   Travell Desaulniers H 07/12/2011 4:50 AM

## 2011-07-12 NOTE — Anesthesia Postprocedure Evaluation (Signed)
  Anesthesia Post-op Note  Patient: Leslie Jackson  Procedure(s) Performed: Procedure(s) (LRB): CESAREAN SECTION (N/A)  Patient is awake, responsive, moving her legs, and has signs of resolution of her numbness. Pain and nausea are reasonably well controlled. Vital signs are stable and clinically acceptable. Oxygen saturation is clinically acceptable. There are no apparent anesthetic complications at this time. Patient is ready for discharge.

## 2011-07-12 NOTE — Progress Notes (Signed)
Dr. Despina Hidden at the bedside to discuss risks and benefits of caesarean section delivery. Pt states understanding and consent. Consent form signed.

## 2011-07-12 NOTE — Transfer of Care (Signed)
Immediate Anesthesia Transfer of Care Note  Patient: Leslie Jackson  Procedure(s) Performed: Procedure(s) (LRB): CESAREAN SECTION (N/A)  Patient Location: PACU  Anesthesia Type: Epidural  Level of Consciousness: awake, alert , oriented and patient cooperative  Airway & Oxygen Therapy: Patient Spontanous Breathing  Post-op Assessment: Report given to PACU RN and Post -op Vital signs reviewed and stable  Post vital signs: Reviewed and stable  Complications: No apparent anesthesia complications

## 2011-07-12 NOTE — Progress Notes (Signed)
Dr. Mikel Cella notified of SVE and epidural level. Orders received to labor down.

## 2011-07-12 NOTE — Addendum Note (Signed)
Addendum  created 07/12/11 1555 by Earmon Phoenix, CRNA   Modules edited:Notes Section

## 2011-07-12 NOTE — Anesthesia Postprocedure Evaluation (Signed)
  Anesthesia Post-op Note  Patient: Leslie Jackson  Procedure(s) Performed: Procedure(s) (LRB): CESAREAN SECTION (N/A)  Patient Location: Mother/Baby  Anesthesia Type: Epidural  Level of Consciousness: awake, alert  and oriented  Airway and Oxygen Therapy: Patient Spontanous Breathing  Post-op Pain: mild  Post-op Assessment: Patient's Cardiovascular Status Stable and Respiratory Function Stable  Post-op Vital Signs: stable  Complications: No apparent anesthesia complications

## 2011-07-12 NOTE — Progress Notes (Signed)
Patient ID: Leslie Jackson, female   DOB: [birth year], 21 y.o.   MRN: [id number]  Patient pushing for 2hr 15 minutes and is exhausted.  Very little fetal descent, still at approximately +1 station.  Patient requesting cesarean section.  The risks of cesarean section discussed with the patient included but were not limited to: bleeding which may require transfusion or reoperation; infection which may require antibiotics; injury to bowel, bladder, ureters or other surrounding organs; injury to the fetus; need for additional procedures including hysterectomy in the event of a life-threatening hemorrhage; placental abnormalities wth subsequent pregnancies, incisional problems, thromboembolic phenomenon and other postoperative/anesthesia complications. The patient concurred with the proposed plan, giving informed written consent for the procedure.    Anesthesia and OR aware.  Preoperative prophylactic Ancef ordered on call to the OR.  To OR when ready.

## 2011-07-12 NOTE — Addendum Note (Signed)
Addendum  created 07/12/11 2143 by Len Blalock, CRNA   Modules edited:Notes Section

## 2011-07-12 NOTE — Progress Notes (Signed)
Dr. Adrian Blackwater at the bedside and notified of FHR, decelerations and nursing interventions.

## 2011-07-12 NOTE — Progress Notes (Signed)
Dr. Jean Rosenthal notified that pts pain is not being controlled with epidural

## 2011-07-13 NOTE — Progress Notes (Signed)
Subjective: Postpartum Day 1: Cesarean Delivery Patient reports tolerating PO, + flatus and no problems voiding.    Objective: Vital signs in last 24 hours: Temp:  [97.6 F (36.4 C)-98.9 F (37.2 C)] 98 F (36.7 C) (03/17 0412) Pulse Rate:  [69-109] 92  (03/17 0412) Resp:  [16-18] 16  (03/17 0412) BP: (95-121)/(60-79) 95/60 mmHg (03/17 0412) SpO2:  [93 %-96 %] 96 % (03/17 0412)  Physical Exam:  General: alert, cooperative and no distress Lochia: appropriate Uterine Fundus: firm Incision: healing well, no significant drainage, no dehiscence, no significant erythema DVT Evaluation: No evidence of DVT seen on physical exam.   Basename 07/11/11 0218  HGB 12.5  HCT 37.6    Assessment/Plan: Status post Cesarean section. Doing well postoperatively.  Continue current care. Lactation to see mom.  Amiel Mccaffrey 07/13/2011, 7:30 AM

## 2011-07-13 NOTE — Progress Notes (Signed)
Chart reviewed and agree with management and plan.  

## 2011-07-14 NOTE — Progress Notes (Signed)
PGY 2 addendum:   I have seen and examined patient and agree with student's note and plan.  Patient is asking to go home early today, this would be OK from an Obstetrics standpoint, but nursing reports concerns about patient's age and infant poor feeding pattern.  Will discuss early discharge with pediatrics.   Leslie Jackson 07/14/2011 9:01 AM

## 2011-07-14 NOTE — Progress Notes (Signed)
Post Partum Day/POD #2 Leslie Jackson is a 21 yo G1P1001 at PPD/POD #2 after LTCS   Subjective: no complaints, up ad lib, voiding, tolerating PO and + flatus Leslie Jackson is a 21 yo G1P1001 at PPD/POD #2 after LTCS doing well with no complaints at this time.  Her pain is well controlled with medication.  She has minimal lochia.  She notes no drainage with her incision. She is urinating, walking, and gas without difficulty. She continues to breast feed.  Nursing staff report infant is not feeding as well as the nursery would like.  She plans on having Depo for future family planning.  She doesn't plan on having her son circumcised.  Objective: Blood pressure 97/61, pulse 82, temperature 97.7 F (36.5 C), temperature source Axillary, resp. rate 18, height 5\' 4"  (1.626 m), weight 82.555 kg (182 lb), last menstrual period 09/30/2010, SpO2 96.00%, unknown if currently breastfeeding.  Physical Exam:  General: alert, cooperative, appears stated age and no distress Lochia: appropriate Uterine Fundus: firm Incision: healing well, no significant drainage, no dehiscence, no significant erythema DVT Evaluation: No evidence of DVT seen on physical exam. Slight pedal edema.  No results found for this basename: HGB:2,HCT:2 in the last 72 hours  Assessment/Plan: Plan for discharge tomorrow and Breastfeeding 21 yo G1P1001 at PPD/POD #2 after LTCS  LOS: 4 days   Mardene Speak 07/14/2011, 7:57 AM

## 2011-07-14 NOTE — Progress Notes (Signed)
UR chart review completed.  

## 2011-07-15 ENCOUNTER — Encounter: Payer: Self-pay | Admitting: Family Medicine

## 2011-07-15 ENCOUNTER — Encounter (HOSPITAL_COMMUNITY): Payer: Self-pay | Admitting: Obstetrics & Gynecology

## 2011-07-15 MED ORDER — OXYCODONE-ACETAMINOPHEN 5-325 MG PO TABS: 1.0000 | ORAL_TABLET | ORAL | Status: AC | PRN

## 2011-07-15 MED ORDER — IBUPROFEN 600 MG PO TABS: 600.0000 mg | ORAL_TABLET | Freq: Four times a day (QID) | ORAL | Status: AC | PRN

## 2011-07-15 NOTE — Discharge Summary (Signed)
PGY2 Addendum  I have seen and examined this patient and agree with student's note.  Patient will follow up with [facility].  RX for percocet #30.

## 2011-07-15 NOTE — Discharge Instructions (Signed)

## 2011-07-15 NOTE — Progress Notes (Signed)
Post Partum Day/POD #3 Leslie Jackson is a 21 yo G1P1001 at PPD/POD #3 after LTCS  Subjective: Leslie Jackson is a 22 yo G1P1001 at PPD/POD #3 after LTCS doing well with no complaints at this time.  Her pain is well controlled with Ibuprofen and Percocet.  She has minimal lochia.  She notes no drainage with her incision. She is urinating, walking, passing stools and gas without difficulty. She continues to breast and bottle feed.  She is uncertain if she will use Depo or IUD for future family planning.  She will plan on discussing this at her 6 week postpartum follow up at [facility].  She doesn't plan on having her son circumcised. no complaints, up ad lib, voiding, tolerating PO and + flatus  Objective: Blood pressure 96/60, pulse 62, temperature 97.8 F (36.6 C), temperature source Oral, resp. rate 18, height 5\' 4"  (1.626 m), weight 82.555 kg (182 lb), last menstrual period 09/30/2010, SpO2 96.00%, unknown if currently breastfeeding.  Physical Exam:  General: alert, cooperative, appears stated age and no distress Lochia: appropriate Uterine Fundus: firm and approximately 1 cm inferior to umbilicus Incision: healing well, no significant drainage, no dehiscence, no significant erythema DVT Evaluation: No evidence of DVT seen on physical exam.  Slight pedal edema noted.   No results found for this basename: HGB:2,HCT:2 in the last 72 hours  Assessment/Plan: 21 yo G1P1001 at PPD/POD #3 after LTCS  Discharge home   LOS: 5 days   Mardene Speak 07/15/2011, 8:15 AM

## 2011-07-15 NOTE — Progress Notes (Signed)
PGY2 Addendum:   I have seen and examined this patient and agree with student's note.  Patient doing well post-op, pain well controlled, incision without drainage or erythema. Discharge home today.   Leslie Jackson 07/15/2011 8:51 AM

## 2011-07-15 NOTE — Discharge Summary (Signed)
Obstetric Discharge Summary Reason for Admission: onset of labor Prenatal Procedures: NST and ultrasound Intrapartum Procedures: cesarean: low cervical, transverse Postpartum Procedures: none Complications-Operative and Postpartum: prolonged 2nd stage, failure to descend Hemoglobin  Date Value Range Status  07/11/2011 12.5  12.0-15.0 (g/dL) Final     HCT  Date Value Range Status  07/11/2011 37.6  36.0-46.0 (%) Final    Physical Exam:  General: alert, cooperative, appears stated age and no distress Lochia: appropriate Uterine Fundus: firm Incision: healing well, no significant drainage, no dehiscence, no significant erythema DVT Evaluation: No evidence of DVT seen on physical exam.  Discharge Diagnoses: Term Pregnancy-delivered  Discharge Information: Date: 07/15/2011 Activity: pelvic rest Diet: routine Medications: PNV, Ibuprofen and Percocet Condition: stable Instructions: refer to practice specific booklet Discharge to: home   Newborn Data: Live born female  Birth Weight: 8 lb 3 oz (3714 g) APGAR: 8, 9  Home with mother.  Mardene Speak 07/15/2011, 8:30 AM

## 2011-07-17 ENCOUNTER — Inpatient Hospital Stay (HOSPITAL_COMMUNITY): Admission: RE | Admit: 2011-07-17 | Payer: Medicaid Other | Source: Ambulatory Visit

## 2011-07-24 ENCOUNTER — Inpatient Hospital Stay (HOSPITAL_COMMUNITY)
Admission: AD | Admit: 2011-07-24 | Discharge: 2011-07-24 | Disposition: A | Discharge status: Home / Self Care | Payer: Medicaid Other | Source: Ambulatory Visit | Attending: Family Medicine | Admitting: Family Medicine

## 2011-07-24 ENCOUNTER — Encounter (HOSPITAL_COMMUNITY): Payer: Self-pay | Admitting: *Deleted

## 2011-07-24 DIAGNOSIS — O909 Complication of the puerperium, unspecified: Secondary | ICD-10-CM | POA: Insufficient documentation

## 2011-07-24 DIAGNOSIS — O900 Disruption of cesarean delivery wound: Secondary | ICD-10-CM

## 2011-07-24 NOTE — Progress Notes (Signed)
Abdominal incision well approximated on ends, area of pink/red tissue noted in center.  Dried yellowish drainage noted around entire incision.  No redness noted.

## 2011-07-24 NOTE — MAU Provider Note (Signed)
Chart reviewed and agree with management and plan.  

## 2011-07-24 NOTE — Discharge Instructions (Signed)
Wound Dehiscence Wound dehiscence is when a surgical cut (incision) opens up. It usually happens 7 to 10 days after surgery. You may have pain, a fever, or have more fluid coming from the cut. It should be treated early. HOME CARE  Only take medicines as told by your doctor.   Take your medicines (antibiotics) as told. Finish them even if you start to feel better.   Wash your wound with warm, soapy water 2 times a day, or as told. Pat the wound dry. Do not rub the wound.   Change bandages (dressings) as often as told. Wash your hands before and after changing bandages. Apply bandages as told.   Take showers. Do not soak the wound, bathe, or swim until your wound is healed.   Avoid exercises that make you sweat.   Use medicines that stop itching as told by your doctor. The wound may itch as it heals. Do not pick or scratch at the wound.   Do not lift more than 10 pounds (4.5 kilograms) until the wound is healed, or as told by your doctor.   Keep all doctor visits as told.  GET HELP RIGHT AWAY IF:   You have more puffiness (swelling) or redness around the wound.   You have more pain in the wound.   You have yellowish white fluid (pus) coming from the wound.   More of the wound breaks open.   You have a fever.  MAKE SURE YOU:   Understand these instructions.   Will watch your condition.   Will get help right away if you are not doing well or get worse.  Document Released: 04/02/2009 Document Revised: 04/03/2011 Document Reviewed: 08/18/2010 [facility] Patient Information 2012 [place], [place].

## 2011-07-24 NOTE — MAU Provider Note (Signed)
See Dr. Melina Modena note above. ABD: incision with 2 cm central superficial wound separation with subQ skin protruding. Probed 2cm depth and packed with plain guaze stip with NS. Mild induration R>L, no erythema or drainage, NT.  A: Superficial C-section wound separation  P: Instructed her and husband in how to pack it daily. They are reluctant to try. Will return here tomorrow for recheck and pack again. Then if they cannot do daily packings will advise her to cleanse it well with 1/2 strength H2O2 tid.

## 2011-07-24 NOTE — MAU Provider Note (Signed)
  History     CSN: [id number]  Arrival date and time: 07/24/11 1750   None     Chief Complaint  Patient presents with  . Incisional Pain   HPI  Patient comes in complaining of part of her c-section incision being open and draining.  She had a c-section two weeks ago.  She says the incision is a little painful.  It is draining orange fluids.  She says it opened on Monday and has slowly gotten bigger.  She denies fevers, chills, nausea vomiting.    OB History    Grav Para Term Preterm Abortions TAB SAB Ect Mult Living   1 1 1  0 0 0 0 0 0 1      Past Medical History  Diagnosis Date  . No pertinent past medical history     Past Surgical History  Procedure Date  . No past surgeries   . Cesarean section 07/12/2011    Procedure: CESAREAN SECTION;  Surgeon: Lazaro Arms, MD;  Location: [facility];  Service: Gynecology;  Laterality: N/A;    Family History  Problem Relation Age of Onset  . Anesthesia problems Neg Hx   . Hypotension Neg Hx   . Malignant hyperthermia Neg Hx   . Pseudochol deficiency Neg Hx     History  Substance Use Topics  . Smoking status: Never Smoker   . Smokeless tobacco: Never Used  . Alcohol Use: No    Allergies: No Known Allergies  Prescriptions prior to admission  Medication Sig Dispense Refill  . ibuprofen (ADVIL,MOTRIN) 600 MG tablet Take 1 tablet (600 mg total) by mouth every 6 (six) hours as needed for pain.  30 tablet  0  . oxyCODONE-acetaminophen (PERCOCET) 5-325 MG per tablet Take 1 tablet by mouth every 3 (three) hours as needed (moderate - severe pain).  30 tablet  0  . prenatal vitamin w/FE, FA (PRENATAL 1 + 1) 27-1 MG TABS Take 1 tablet by mouth daily.          Review of Systems  Constitutional: Negative for fever and chills.  Eyes: Negative for double vision.  Respiratory: Negative for shortness of breath.   Cardiovascular: Negative for chest pain.  Gastrointestinal: Negative for nausea and vomiting.  Genitourinary: Negative for  dysuria.  Skin: Negative for rash.  Neurological: Negative for dizziness and headaches.   Physical Exam   Blood pressure 110/59, pulse 86, temperature 98.4 F (36.9 C), temperature source Oral, resp. rate 20, last menstrual period 09/30/2010, not currently breastfeeding.  Physical Exam  Vitals reviewed. Constitutional: She is oriented to person, place, and time. She appears well-developed and well-nourished. No distress.  HENT:  Head: Normocephalic and atraumatic.  Eyes: Pupils are equal, round, and reactive to light.  Neck: Normal range of motion.  Cardiovascular: Normal rate and regular rhythm.   Respiratory: Effort normal and breath sounds normal.  GI: Soft. Bowel sounds are normal.  Musculoskeletal: She exhibits no edema.  Neurological: She is alert and oriented to person, place, and time.  Skin:       Patient with LTCS incision.  There is about a 3 cm section of open skin, with thin discharge.  There is derma bond present still on edges of incision, which is easily removed.  There is no palpable/probably pocket of fluid, no erythema around wound.     MAU Course  Procedures  MDM   Assessment and Plan    CHAMBERLAIN,RACHEL 07/24/2011, 6:49 PM

## 2011-07-24 NOTE — MAU Note (Signed)
Primary c/s on 03/16.  Started leaking fluid from incision on Monday- lt orange in color, no odor noted.  Pain initially was on on the left side, now it is on the right. Bleeding is getting lighter.  Baby is doing well, is bottle feeding- "he wouldn't take it".

## 2011-07-25 ENCOUNTER — Inpatient Hospital Stay (HOSPITAL_COMMUNITY)
Admission: AD | Admit: 2011-07-25 | Discharge: 2011-07-25 | Disposition: A | Discharge status: Home / Self Care | Payer: Medicaid Other | Source: Ambulatory Visit | Attending: Obstetrics & Gynecology | Admitting: Obstetrics & Gynecology

## 2011-07-25 ENCOUNTER — Encounter (HOSPITAL_COMMUNITY): Payer: Self-pay | Admitting: *Deleted

## 2011-07-25 DIAGNOSIS — O900 Disruption of cesarean delivery wound: Secondary | ICD-10-CM

## 2011-07-25 DIAGNOSIS — O909 Complication of the puerperium, unspecified: Secondary | ICD-10-CM | POA: Insufficient documentation

## 2011-07-25 NOTE — Discharge Instructions (Signed)
Dehiscencia de suturas  (Wound Dehiscence)  La dehiscencia se produce cuando un corte quirrgico (incisin) se abre. Generalmente ocurre entre 7 y [address] despus de la operacin. Puede sentir dolor, subirle la fiebre o supurar lquido por la herida. Debe tratarse rpidamente. CUIDADOS EN EL HOGAR   Tome slo los medicamentos que le haya indicado el mdico.   Tome los medicamentos (antibiticos) tal como se le indic. Tmelos todos, aunque se sienta mejor.   Lvese la herida con agua jabonosa tibia 2 veces por da, o segn las indicaciones. Squela dando golpecitos. No la frote.   Cambie el vendaje con la frecuencia que le indicaron. Lvese bien las manos antes y despus de cambiarse el vendaje. Colquelo segn las instrucciones.   Puede tomar Shaune Spittle. No remoje la herida, no tome baos de inmersin ni practique natacin hasta que la herida se haya curado.   Evite realizar ejercicios fsicos que lo hagan transpirar.   Use medicamentos para calmar la picazn segn las indicaciones del mdico. La herida puede picar cuando se cura. No se toque ni se rasque la herida.   Nolevante objetos que pesen ms de 10 libras (4.5 kilogramos) hasta que la herida se cure, o segn las indicaciones del mdico.   Cumpla con los controles mdicos segn las indicaciones.  SOLICITE AYUDA DE INMEDIATO SI:   Aumenta la inflamacin (hinchazn) o el enrojecimiento alrededor de la herida.   Siente [employer] laboratory technician.   Observa una secrecin de color blanco amarillento (pus) en la herida.   La herida se abre ms.   Tiene fiebre.  ASEGRESE DE QUE:   Comprende estas instrucciones.   Controlar su enfermedad.   Solicitar ayuda de inmediato si no mejora o si empeora.  Document Released: 10/28/2010 Document Revised: 04/03/2011 [facility] Patient Information 2012 [place], [place].

## 2011-07-25 NOTE — MAU Note (Signed)
C/s on 3/16 had opened area was packed yesterday told to come back today for change

## 2011-07-25 NOTE — MAU Note (Signed)
Incisional opening packed yesterday, here for removal as instructed.

## 2011-07-25 NOTE — MAU Provider Note (Signed)
  History     CSN: [id number]  Arrival date and time: 07/25/11 1745   First Provider Initiated Contact with Patient 07/25/11 1816      Chief Complaint  Patient presents with  . Wound Check   HPI This is a 21 y.o. who is S/P Cesarean Section on 07/12/11 who presented on 07/24/11 with an opening of the center portion of her incision. It was packed with gauze and pt instructed to come back today. They do not want to try to pack it at home. She reports minimal pain at incision.  OB History    Grav Para Term Preterm Abortions TAB SAB Ect Mult Living   1 1 1  0 0 0 0 0 0 1      Past Medical History  Diagnosis Date  . No pertinent past medical history     Past Surgical History  Procedure Date  . Cesarean section 07/12/2011    Procedure: CESAREAN SECTION;  Surgeon: Lazaro Arms, MD;  Location: [facility];  Service: Gynecology;  Laterality: N/A;    Family History  Problem Relation Age of Onset  . Anesthesia problems Neg Hx   . Hypotension Neg Hx   . Malignant hyperthermia Neg Hx   . Pseudochol deficiency Neg Hx   . Hearing loss Neg Hx     History  Substance Use Topics  . Smoking status: Never Smoker   . Smokeless tobacco: Never Used  . Alcohol Use: No    Allergies: No Known Allergies  Prescriptions prior to admission  Medication Sig Dispense Refill  . ibuprofen (ADVIL,MOTRIN) 600 MG tablet Take 1 tablet (600 mg total) by mouth every 6 (six) hours as needed for pain.  30 tablet  0  . oxyCODONE-acetaminophen (PERCOCET) 5-325 MG per tablet Take 1 tablet by mouth every 3 (three) hours as needed (moderate - severe pain).  30 tablet  0    Review of Systems  Constitutional: Negative for fever and chills.  Gastrointestinal: Negative for nausea and vomiting.   Physical Exam   Blood pressure 103/68, pulse 75, temperature 97.4 F (36.3 C), temperature source Oral, resp. rate 16, last menstrual period 09/30/2010, unknown if currently breastfeeding.  Physical Exam  Constitutional:  She is oriented to person, place, and time. She appears well-developed and well-nourished. No distress.  HENT:  Head: Normocephalic.  Cardiovascular: Normal rate.   Respiratory: Effort normal.  GI: Soft. She exhibits no distension. There is tenderness (mild, over incision). There is no rebound and no guarding.  Musculoskeletal: Normal range of motion.  Neurological: She is alert and oriented to person, place, and time.  Skin: Skin is warm and dry.  Psychiatric: She has a normal mood and affect.  Incision well-approximated except for the center portion which is open about 2cm.  The cavity admits about 8-10 inches of quarter inch gauze (repacked by me). Minimal erethema around incision.  MAU Course  Procedures  MDM Will consult Care Management to arrange for Home Care services for daily packing.  Assessment and Plan  A:  Wound Dehiscence P;  Discharge home      Care Management to arrange for home wound care  [facility] 07/25/2011, 6:40 PM

## 2011-07-28 NOTE — MAU Provider Note (Signed)
Chart reviewed and agree with management and plan.  

## 2011-08-13 ENCOUNTER — Encounter: Payer: Self-pay | Admitting: Family Medicine

## 2011-08-13 ENCOUNTER — Ambulatory Visit (INDEPENDENT_AMBULATORY_CARE_PROVIDER_SITE_OTHER): Payer: Self-pay | Admitting: Family Medicine

## 2011-08-13 VITALS — BP 102/65 | HR 64 | Temp 98.3°F | Ht 63.0 in | Wt 155.3 lb

## 2011-08-13 DIAGNOSIS — O900 Disruption of cesarean delivery wound: Secondary | ICD-10-CM | POA: Insufficient documentation

## 2011-08-13 DIAGNOSIS — Z09 Encounter for follow-up examination after completed treatment for conditions other than malignant neoplasm: Secondary | ICD-10-CM

## 2011-08-13 NOTE — Progress Notes (Signed)
No problems with incision site. Pt still has steri strips in place.

## 2011-08-13 NOTE — Progress Notes (Signed)
  Subjective:    Patient ID: Leslie Jackson, female    DOB: [birth year], 21 y.o.   MRN: [id number]  HPI  Patient seen for follow up of incision dehiscence in the center of her low transverse skin incision.  Home health has been seeing her weekly.  She has been packing her incision at home.  Denies drainage, erythema, pain, fever, chills.  Has decreased in size.   Review of Systems     Objective:   Physical Exam  Constitutional: She appears well-developed.  Abdominal: Soft. She exhibits no distension and no mass. There is no tenderness. There is no rebound and no guarding.       Well healed incision except for 7.5 mm opening in center of incision.  Approximately 5mm deep. No erythema or purulent drainage.  Psychiatric: She has a normal mood and affect. Her behavior is normal. Judgment and thought content normal.      Assessment & Plan:  1.  Wound dehiscence Continue with packing and healing by secondary intension.  No signs of wound infection.  Follow up in 2 weeks for postpartum check.

## 2011-08-13 NOTE — Patient Instructions (Signed)
Your incision looks good - there are no signs of infection. Continue with packing.  Please return in 2 weeks for postpartum check

## 2011-08-29 ENCOUNTER — Encounter: Payer: Self-pay | Admitting: Family Medicine

## 2011-08-29 ENCOUNTER — Ambulatory Visit (INDEPENDENT_AMBULATORY_CARE_PROVIDER_SITE_OTHER): Payer: Self-pay | Admitting: Family Medicine

## 2011-08-29 DIAGNOSIS — O900 Disruption of cesarean delivery wound: Secondary | ICD-10-CM

## 2011-08-29 LAB — POCT PREGNANCY, URINE: Preg Test, Ur: NEGATIVE

## 2011-08-29 NOTE — Progress Notes (Signed)
  Subjective:     Leslie Jackson is a 21 y.o. female who presents for a postpartum visit. She is 6 weeks postpartum following a low transverse Cesarean section. I have fully reviewed the prenatal and intrapartum course. The delivery was at 40.5 gestational weeks. Outcome: primary cesarean section, low transverse incision. Anesthesia: epidural. Postpartum course has been complicated by wound dehiscence. Baby's course has been uncomplicated. Baby is feeding by bottle. Bleeding no bleeding. Bowel function is normal. Bladder function is normal. Patient is not sexually active. Contraception method is IUD. Postpartum depression screening: negative.  The following portions of the patient's history were reviewed and updated as appropriate: allergies, current medications, past family history, past medical history, past social history, past surgical history and problem list.  Review of Systems Pertinent items are noted in HPI.   Objective:    BP 97/65  Pulse 50  Temp 97.1 F (36.2 C)  Ht 5\' 3"  (1.6 m)  Wt 153 lb 8 oz (69.627 kg)  BMI 27.19 kg/m2  LMP 08/25/2011  Breastfeeding? No  General:  alert, cooperative and no distress  Lungs: clear to auscultation bilaterally  Heart:  regular rate and rhythm, S1, S2 normal, no murmur, click, rub or gallop  Abdomen: soft, non-tender; bowel sounds normal; no masses,  no organomegaly and 5mm opening at center of incision that is 2mm deep        Assessment:    normal postpartum exam. Pap smear not done at today's visit.   Plan:    1. Contraception: IUD 2. Wound dehiscence - no packing needed as very shallow now. 3. Follow up in: 1 year or as needed.

## 2011-08-29 NOTE — Patient Instructions (Signed)

## 2012-01-23 IMAGING — US US OB DETAIL+14 WK
2 series · 12 of 28 positions shown · non-contrast
Comparison: none

[Series 1: us ob detail +14 wk · 11 acquisitions, 2 frames shown (1 of 2)]
[im 4/11]
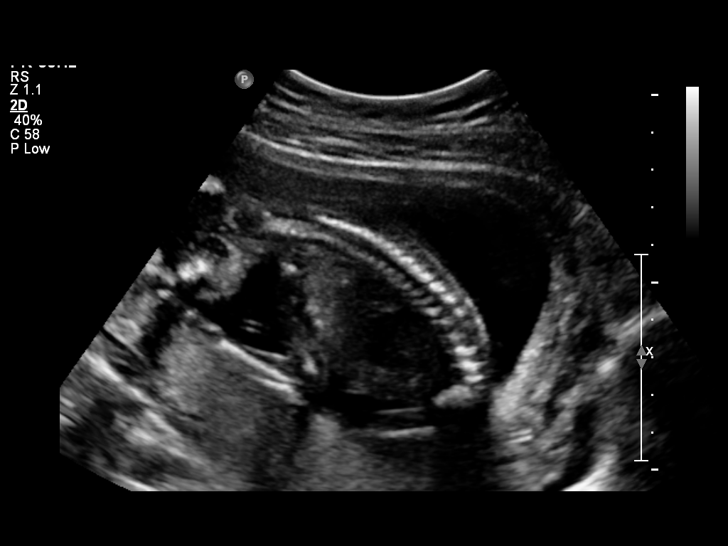
[im 11/11]
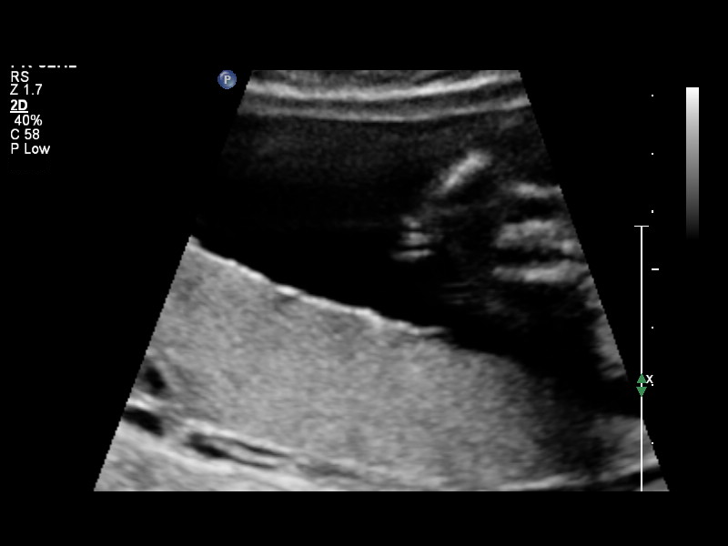

[Series 1: us ob detail +14 wk · 10 of 67 slices shown (2 of 2)]
[im 3/67]
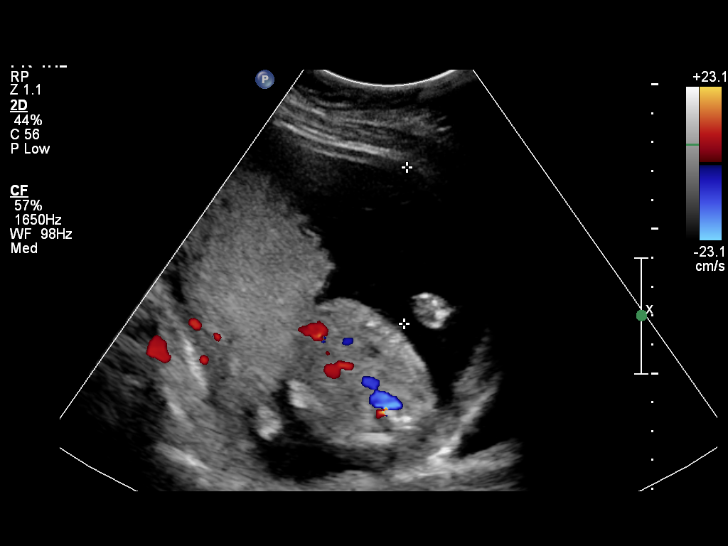
[im 12/67]
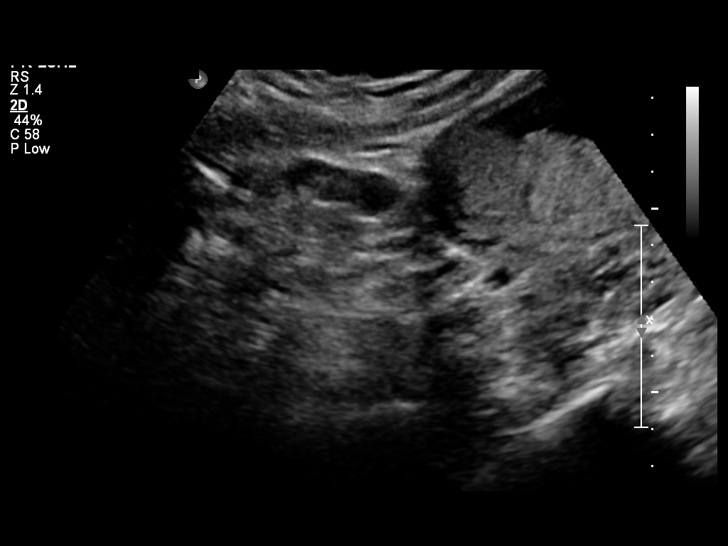
[im 18/67]
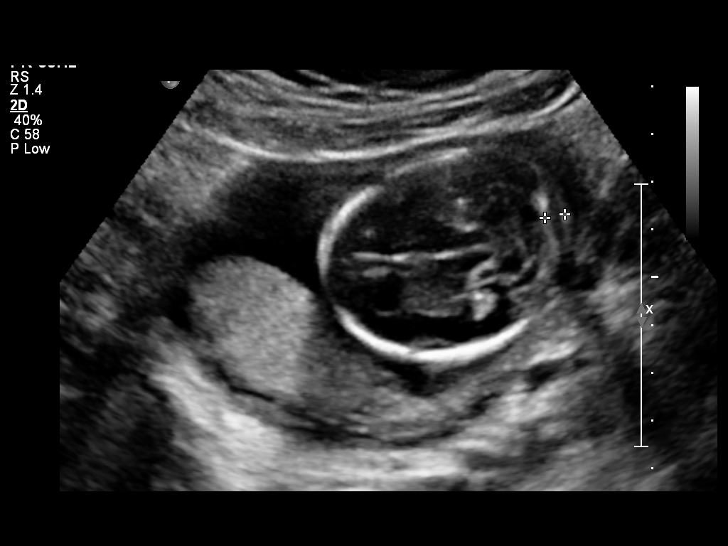
[im 23/67]
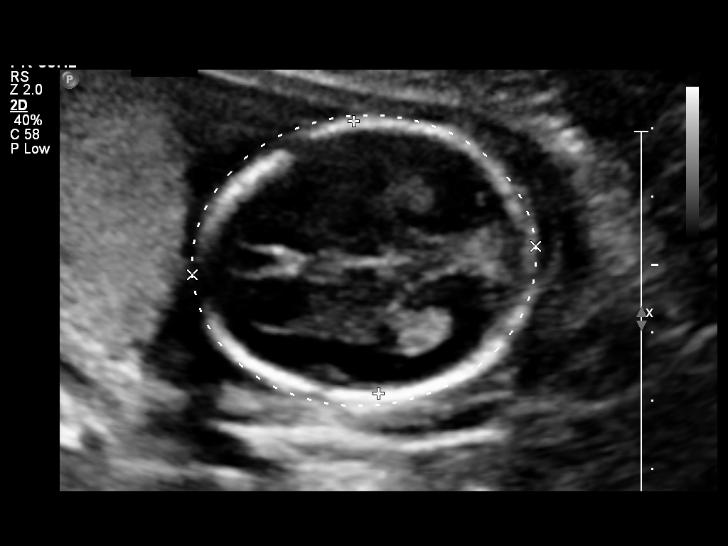
[im 32/67]
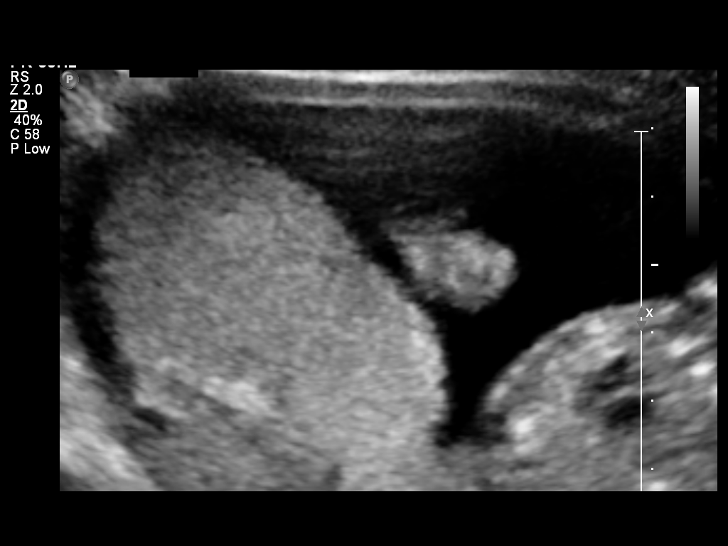
[im 38/67]
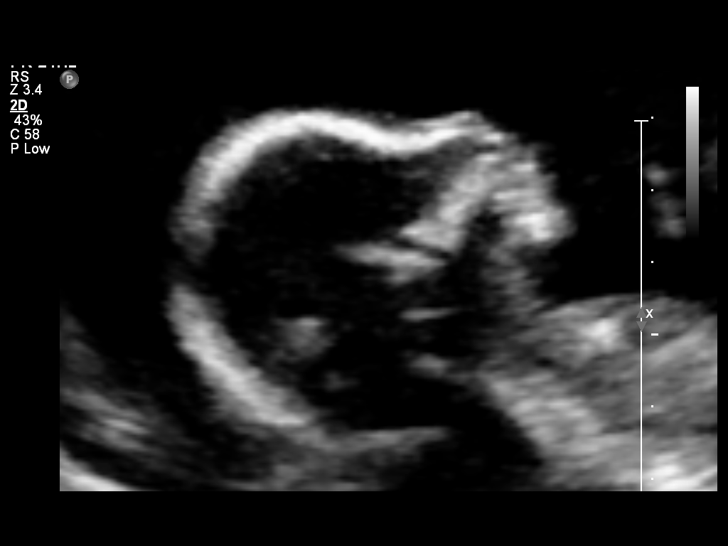
[im 44/67]
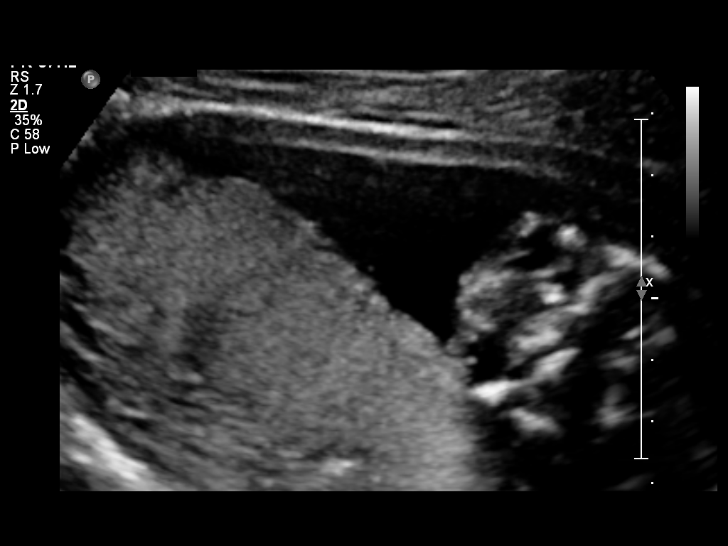
[im 52/67]
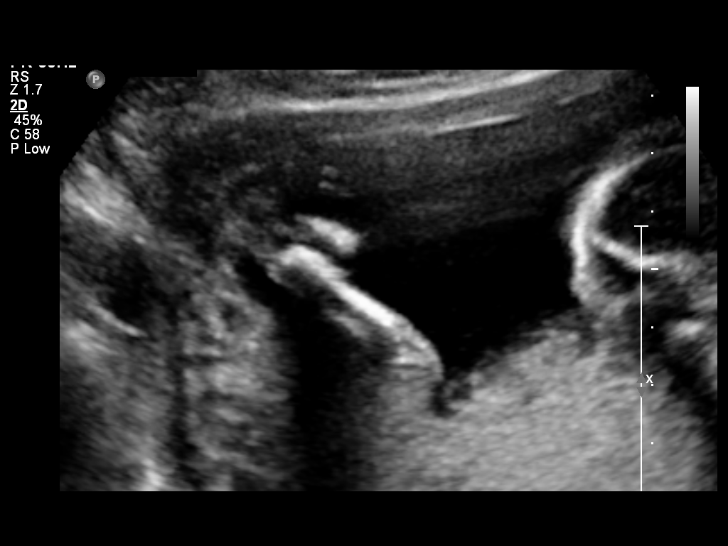
[im 58/67]
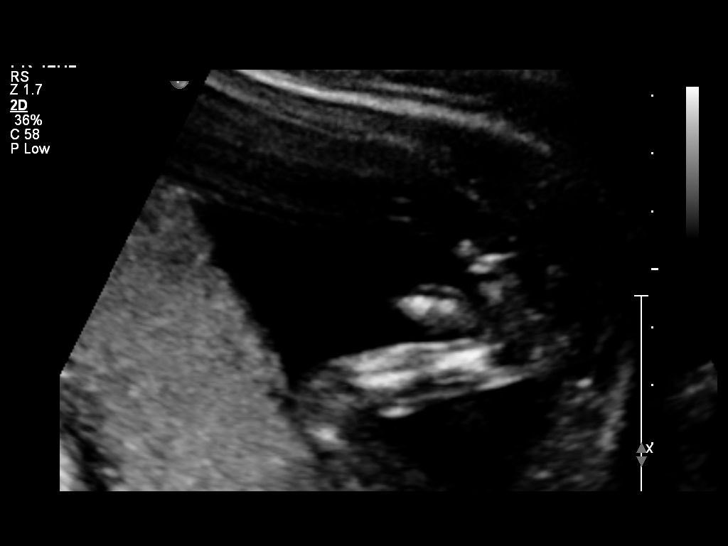
[im 64/67]
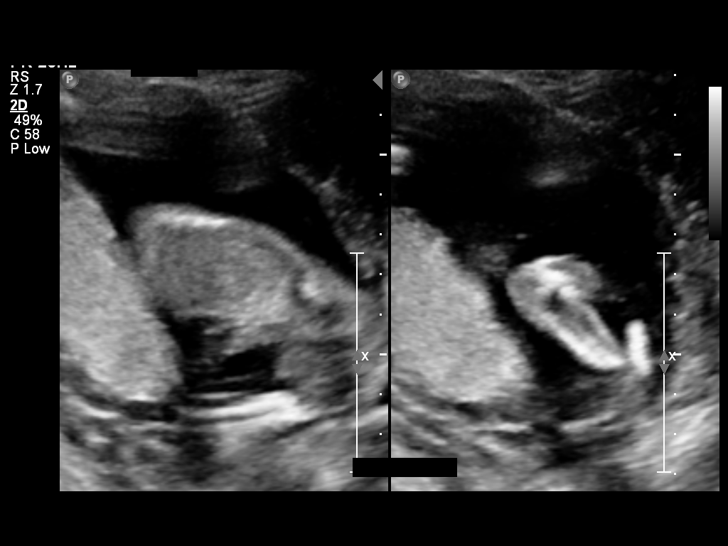

[12 of 28 positions shown; findings below may reference images not displayed]

OBSTETRICS REPORT
                      (Signed Final 02/04/2011 [DATE])

 Order#:         [id number]
Procedures

 US OB DETAIL + 14 WK                                  76811.0
Indications

 Detailed fetal anatomic survey
Fetal Evaluation

 Fetal Heart Rate:  146                          bpm
 Cardiac Activity:  Observed
 Presentation:      Variable
 Placenta:          Right lateral, above
                    cervical os
 P. Cord            Visualized
 Insertion:

 Amniotic Fluid
 AFI FV:      Subjectively within normal limits
                                             Larg Pckt:     5.4  cm
Biometry

 BPD:     39.7  mm     G. Age:  18w 0d                CI:        72.67   70 - 86
 HC:     148.1  mm     G. Age:  17w 6d       32  %    HC/AC:      1.09   1.07 -

 AC:     135.7  mm     G. Age:  19w 0d       76  %
 HUM:     25.1  mm     G. Age:  17w 6d       46  %
 CER:     18.8  mm     G. Age:  18w 3d       58  %
 NFT:     4.22  mm
 Est. FW:     242  gm      0 lb 9 oz     54  %
Gestational Age

 LMP:           18w 1d        Date:  09/30/10                 EDD:   07/07/11
 U/S Today:     18w 2d                                        EDD:   07/06/11
 Best:          18w 1d     Det. By:  LMP  (09/30/10)          EDD:   07/07/11
Anatomy

 Cranium:           Appears normal      Aortic Arch:       Not well
                                                           visualized
 Fetal Cavum:       Appears normal      Ductal Arch:       Not well
                                                           visualized
 Ventricles:        Appears normal      Diaphragm:         Appears normal
 Choroid Plexus:    Appears normal      Stomach:           Appears
                                                           normal, left
                                                           sided
 Cerebellum:        Appears normal      Abdomen:           Appears normal
 Posterior Fossa:   Appears normal      Abdominal Wall:    Appears nml
                                                           (cord insert,
                                                           abd wall)
 Nuchal Fold:       Appears normal      Cord Vessels:      Appears normal
                    (neck, nuchal                          (3 vessel cord)
                    fold)
 Face:              Appears normal      Kidneys:           Appear normal
                    (lips/profile/orbit
                    s)
 Heart:             Not well            Bladder:           Appears normal
                    visualized
 RVOT:              Appears normal      Spine:             Appears normal
 LVOT:              Appears normal      Limbs:             Appears normal
                                                           (hands, ankles,
                                                           feet)

 Other:     Technically difficult due to fetal position. Heels visualized.
            LT 5th visualized.
Targeted Anatomy

 Fetal Central Nervous System
 Lat. Ventricles:    4.7                Cisterna Magna:
Cervix Uterus Adnexa

 Cervical Length:    3.22     cm

 Cervix:       Normal appearance by transabdominal scan.

 Adnexa:     No abnormality visualized.
Impression

  Single living intrauterine gestation with concordant
 gestational age and normal visualized anatomy. The fetal
 heart is incompletely seen today, so follow up ultrasound in 2
 weeks is recommended.  US modified Trisomy 21 risk
 calculation can be performed when anatomy scan is
 completed.

## 2014-02-27 ENCOUNTER — Encounter: Payer: Self-pay | Admitting: Family Medicine

## 2014-11-24 ENCOUNTER — Inpatient Hospital Stay (HOSPITAL_COMMUNITY): Payer: Self-pay

## 2014-11-24 ENCOUNTER — Inpatient Hospital Stay (HOSPITAL_COMMUNITY)
Admission: AD | Admit: 2014-11-24 | Discharge: 2014-11-24 | Disposition: A | Discharge status: Home / Self Care | Payer: Self-pay | Source: Ambulatory Visit | Attending: Obstetrics & Gynecology | Admitting: Obstetrics & Gynecology

## 2014-11-24 ENCOUNTER — Encounter (HOSPITAL_COMMUNITY): Payer: Self-pay | Admitting: Medical

## 2014-11-24 DIAGNOSIS — O209 Hemorrhage in early pregnancy, unspecified: Secondary | ICD-10-CM

## 2014-11-24 DIAGNOSIS — Z3A01 Less than 8 weeks gestation of pregnancy: Secondary | ICD-10-CM | POA: Insufficient documentation

## 2014-11-24 DIAGNOSIS — R109 Unspecified abdominal pain: Secondary | ICD-10-CM | POA: Insufficient documentation

## 2014-11-24 DIAGNOSIS — O26851 Spotting complicating pregnancy, first trimester: Secondary | ICD-10-CM | POA: Insufficient documentation

## 2014-11-24 HISTORY — DX: Other specified health status: Z78.9

## 2014-11-24 LAB — URINALYSIS, ROUTINE W REFLEX MICROSCOPIC
BILIRUBIN URINE: NEGATIVE
GLUCOSE, UA: NEGATIVE mg/dL
KETONES UR: NEGATIVE mg/dL
Leukocytes, UA: NEGATIVE
NITRITE: NEGATIVE
PH: 7.5 (ref 5.0–8.0)
PROTEIN: NEGATIVE mg/dL
Specific Gravity, Urine: 1.015 (ref 1.005–1.030)
Urobilinogen, UA: 0.2 mg/dL (ref 0.0–1.0)

## 2014-11-24 LAB — CBC WITH DIFFERENTIAL/PLATELET
BASOS PCT: 0 % (ref 0–1)
Basophils Absolute: 0 10*3/uL (ref 0.0–0.1)
EOS ABS: 0.1 10*3/uL (ref 0.0–0.7)
Eosinophils Relative: 1 % (ref 0–5)
HEMATOCRIT: 39.5 % (ref 36.0–46.0)
Hemoglobin: 13.5 g/dL (ref 12.0–15.0)
Lymphocytes Relative: 24 % (ref 12–46)
Lymphs Abs: 2.3 10*3/uL (ref 0.7–4.0)
MCH: 31.8 pg (ref 26.0–34.0)
MCHC: 34.2 g/dL (ref 30.0–36.0)
MCV: 93.2 fL (ref 78.0–100.0)
MONOS PCT: 8 % (ref 3–12)
Monocytes Absolute: 0.8 10*3/uL (ref 0.1–1.0)
NEUTROS PCT: 67 % (ref 43–77)
Neutro Abs: 6.4 10*3/uL (ref 1.7–7.7)
PLATELETS: 147 10*3/uL — AB (ref 150–400)
RBC: 4.24 MIL/uL (ref 3.87–5.11)
RDW: 12.7 % (ref 11.5–15.5)
WBC: 9.5 10*3/uL (ref 4.0–10.5)

## 2014-11-24 LAB — WET PREP, GENITAL
Clue Cells Wet Prep HPF POC: NONE SEEN
Trich, Wet Prep: NONE SEEN
YEAST WET PREP: NONE SEEN

## 2014-11-24 LAB — POCT PREGNANCY, URINE: PREG TEST UR: POSITIVE — AB

## 2014-11-24 LAB — URINE MICROSCOPIC-ADD ON

## 2014-11-24 LAB — ABO/RH: ABO/RH(D): O POS

## 2014-11-24 LAB — HCG, QUANTITATIVE, PREGNANCY: HCG, BETA CHAIN, QUANT, S: 17698 m[IU]/mL — AB (ref <5)

## 2014-11-24 NOTE — MAU Provider Note (Signed)
History     CSN: [id number]  Arrival date and time: 11/24/14 1351   None     Chief Complaint  Patient presents with  . Vaginal Bleeding   HPI  Leslie Jackson is a 24 y.o. G2P1001 at [redacted]w[redacted]d who presents to MAU today with complaint of abdominal pain and spotting today. She states pain is mild. She has not taken anything for pain. She states occasional mild nausea without vomiting, diarrhea or constipation. She denies vaginal discharge, UTI symptoms or fever.  OB History    Gravida Para Term Preterm AB TAB SAB Ectopic Multiple Living   0 0 0 0 0 0 1      Past Medical History  Diagnosis Date  . No pertinent past medical history   . Medical history non-contributory     Past Surgical History  Procedure Laterality Date  . Cesarean section  07/12/2011    Procedure: CESAREAN SECTION;  Surgeon: Lazaro Arms, MD;  Location: [facility];  Service: Gynecology;  Laterality: N/A;    Family History  Problem Relation Age of Onset  . Anesthesia problems Neg Hx   . Hypotension Neg Hx   . Malignant hyperthermia Neg Hx   . Pseudochol deficiency Neg Hx   . Hearing loss Neg Hx     History  Substance Use Topics  . Smoking status: Never Smoker   . Smokeless tobacco: Never Used  . Alcohol Use: No    Allergies: No Known Allergies  No prescriptions prior to admission    Review of Systems  Constitutional: Negative for fever and malaise/fatigue.  Gastrointestinal: Positive for abdominal pain. Negative for nausea, vomiting, diarrhea and constipation.  Genitourinary: Negative for dysuria, urgency and frequency.       + vaginal bleeding   Physical Exam   Blood pressure 101/64, pulse 67, temperature 98.6 F (37 C), temperature source Oral, resp. rate 20, height  (1.6 m), weight 131 lb (59.421 kg).  Physical Exam  Nursing note and vitals reviewed. Constitutional: She is oriented to person, place, and time. She appears well-developed and well-nourished. No distress.   HENT:  Head: Normocephalic and atraumatic.  Cardiovascular: Normal rate.   Respiratory: Effort normal.  GI: Soft. She exhibits no distension and no mass. There is no tenderness. There is no rebound and no guarding.  Genitourinary: Uterus is enlarged (slightly). Uterus is not tender. Cervix exhibits no motion tenderness, no discharge and no friability. Right adnexum displays no mass and no tenderness. Left adnexum displays no mass and no tenderness. There is bleeding (scant) in the vagina. Vaginal discharge (small amount of mucus discharge) found.  Neurological: She is alert and oriented to person, place, and time.  Skin: Skin is warm and dry. No erythema.  Psychiatric: She has a normal mood and affect.   Results for orders placed or performed during the hospital encounter of 11/24/14 (from the past 24 hour(s))  Urinalysis, Routine w reflex microscopic (not at [facility])     Status: Abnormal   Collection Time: 11/24/14  2:30 PM  Result Value Ref Range   Color, Urine YELLOW YELLOW   APPearance CLEAR CLEAR   Specific Gravity, Urine 1.015 1.005 - 1.030   pH 7.5 5.0 - 8.0   Glucose, UA NEGATIVE NEGATIVE mg/dL   Hgb urine dipstick TRACE (A) NEGATIVE   Bilirubin Urine NEGATIVE NEGATIVE   Ketones, ur NEGATIVE NEGATIVE mg/dL   Protein, ur NEGATIVE NEGATIVE mg/dL   Urobilinogen, UA 0.2 0.0 - 1.0  mg/dL   Nitrite NEGATIVE NEGATIVE   Leukocytes, UA NEGATIVE NEGATIVE  Urine microscopic-add on     Status: Abnormal   Collection Time: 11/24/14  2:30 PM  Result Value Ref Range   Squamous Epithelial / LPF FEW (A) RARE   WBC, UA 0-2 <3 WBC/hpf  Pregnancy, urine POC     Status: Abnormal   Collection Time: 11/24/14  2:41 PM  Result Value Ref Range   Preg Test, Ur POSITIVE (A) NEGATIVE  CBC with Differential/Platelet     Status: Abnormal   Collection Time: 11/24/14  3:25 PM  Result Value Ref Range   WBC 9.5 4.0 - 10.5 K/uL   RBC 4.24 3.87 - 5.11 MIL/uL   Hemoglobin 13.5 12.0 - 15.0 g/dL   HCT 16.1  09.6 - 04.5 %   MCV 93.2 78.0 - 100.0 fL   MCH 31.8 26.0 - 34.0 pg   MCHC 34.2 30.0 - 36.0 g/dL   RDW 40.9 81.1 - 91.4 %   Platelets 147 (L) 150 - 400 K/uL   Neutrophils Relative % 67 43 - 77 %   Neutro Abs 6.4 1.7 - 7.7 K/uL   Lymphocytes Relative 24 12 - 46 %   Lymphs Abs 2.3 0.7 - 4.0 K/uL   Monocytes Relative 8 3 - 12 %   Monocytes Absolute 0.8 0.1 - 1.0 K/uL   Eosinophils Relative 1 0 - 5 %   Eosinophils Absolute 0.1 0.0 - 0.7 K/uL   Basophils Relative 0 0 - 1 %   Basophils Absolute 0.0 0.0 - 0.1 K/uL  ABO/Rh     Status: None   Collection Time: 11/24/14  3:25 PM  Result Value Ref Range   ABO/RH(D) O POS   hCG, quantitative, pregnancy     Status: Abnormal   Collection Time: 11/24/14  3:25 PM  Result Value Ref Range   hCG, Beta Chain, Quant, S 17698 (H) <5 mIU/mL  Wet prep, genital     Status: Abnormal   Collection Time: 11/24/14  4:20 PM  Result Value Ref Range   Yeast Wet Prep HPF POC NONE SEEN NONE SEEN   Trich, Wet Prep NONE SEEN NONE SEEN   Clue Cells Wet Prep HPF POC NONE SEEN NONE SEEN   WBC, Wet Prep HPF POC FEW (A) NONE SEEN   US Ob Comp Less 14 Wks  11/24/2014   CLINICAL DATA:  Vaginal bleeding.  EXAM: OBSTETRIC <14 WK [place] AND TRANSVAGINAL OB US  TECHNIQUE: Both transabdominal and transvaginal ultrasound examinations were performed for complete evaluation of the gestation as well as the maternal uterus, adnexal regions, and pelvic cul-de-sac. Transvaginal technique was performed to assess early pregnancy.  COMPARISON:  None.  FINDINGS: Intrauterine gestational sac: Visualized/normal in shape.  Yolk sac:  Present  Embryo:  Present  Cardiac Activity: Present  Heart Rate: 102  bpm  CRL:  2.7  mm   5 w   6 d                  [place] EDC: 07/21/2015  Maternal uterus/adnexae: No subchorionic hemorrhage. Normal ovaries. No adnexal mass. Right ovary measures 2.7 x 1.3 x 2.4 cm. Left ovary measures 2.4 x 2.8 x 2.5 cm. Trace pelvic free fluid.  IMPRESSION: Single live intrauterine  pregnancy as detailed above.   Electronically Signed   By: Elige Ko   On: 11/24/2014 16:07   US Ob Transvaginal  11/24/2014   CLINICAL DATA:  Vaginal bleeding.  EXAM: OBSTETRIC <14  WK [place] AND TRANSVAGINAL OB US  TECHNIQUE: Both transabdominal and transvaginal ultrasound examinations were performed for complete evaluation of the gestation as well as the maternal uterus, adnexal regions, and pelvic cul-de-sac. Transvaginal technique was performed to assess early pregnancy.  COMPARISON:  None.  FINDINGS: Intrauterine gestational sac: Visualized/normal in shape.  Yolk sac:  Present  Embryo:  Present  Cardiac Activity: Present  Heart Rate: 102  bpm  CRL:  2.7  mm   5 w   6 d                  [place] EDC: 07/21/2015  Maternal uterus/adnexae: No subchorionic hemorrhage. Normal ovaries. No adnexal mass. Right ovary measures 2.7 x 1.3 x 2.4 cm. Left ovary measures 2.4 x 2.8 x 2.5 cm. Trace pelvic free fluid.  IMPRESSION: Single live intrauterine pregnancy as detailed above.   Electronically Signed   By: Elige Ko   On: 11/24/2014 16:07    MAU Course  Procedures None  MDM +UPT UA, wet prep, GC/chlamydia, CBC, ABO/Rh, quant hCG, HIV, RPR and [place] today to rule out ectopic pregnancy  Assessment and Plan  A: SIUP at [redacted]w[redacted]d Spotting in pregnancy Abdominal pain in pregnancy  P: Discharge home Bleeding precautions discussed Pelvic rest advised Patient advised to follow-up with [facility] as planned for prenatal care Pregnancy confirmation letter given Patient may return to MAU as needed or if her condition were to change or worsen  Marny Lowenstein, PA-C  11/24/2014, 6:16 PM

## 2014-11-24 NOTE — Discharge Instructions (Signed)
Pelvic Rest Pelvic rest is sometimes recommended for women when:   The placenta is partially or completely covering the opening of the cervix (placenta previa).  There is bleeding between the uterine wall and the amniotic sac in the first trimester (subchorionic hemorrhage).  The cervix begins to open without labor starting (incompetent cervix, cervical insufficiency).  The labor is too early (preterm labor). HOME CARE INSTRUCTIONS  Do not have sexual intercourse, stimulation, or an orgasm.  Do not use tampons, douche, or put anything in the vagina.  Do not lift anything over 10 pounds (4.5 kg).  Avoid strenuous activity or straining your pelvic muscles. SEEK MEDICAL CARE IF:  You have any vaginal bleeding during pregnancy. Treat this as a potential emergency.  You have cramping pain felt low in the stomach (stronger than menstrual cramps).  You notice vaginal discharge (watery, mucus, or bloody).  You have a low, dull backache.  There are regular contractions or uterine tightening. SEEK IMMEDIATE MEDICAL CARE IF: You have vaginal bleeding and have placenta previa.  Document Released: 08/09/2010 Document Revised: 07/07/2011 Document Reviewed: 08/09/2010 [facility] Patient Information 2015 [place], [place]. This information is not intended to replace advice given to you by your health care provider. Make sure you discuss any questions you have with your health care provider.  First Trimester of Pregnancy The first trimester of pregnancy is from week 1 until the end of week 12 (months 1 through 3). During this time, your baby will begin to develop inside you. At 6-8 weeks, the eyes and face are formed, and the heartbeat can be seen on ultrasound. At the end of 12 weeks, all the baby's organs are formed. Prenatal care is all the medical care you receive before the birth of your baby. Make sure you get good prenatal care and follow all of your doctor's instructions. HOME CARE    Medicines  Take medicine only as told by your doctor. Some medicines are safe and some are not during pregnancy.  Take your prenatal vitamins as told by your doctor.  Take medicine that helps you poop (stool softener) as needed if your doctor says it is okay. Diet  Eat regular, healthy meals.  Your doctor will tell you the amount of weight gain that is right for you.  Avoid raw meat and uncooked cheese.  If you feel sick to your stomach (nauseous) or throw up (vomit):  Eat 4 or 5 small meals a day instead of 3 large meals.  Try eating a few soda crackers.  Drink liquids between meals instead of during meals.  If you have a hard time pooping (constipation):  Eat high-fiber foods like fresh vegetables, fruit, and whole grains.  Drink enough fluids to keep your pee (urine) clear or pale yellow. Activity and Exercise  Exercise only as told by your doctor. Stop exercising if you have cramps or pain in your lower belly (abdomen) or low back.  Try to avoid standing for long periods of time. Move your legs often if you must stand in one place for a long time.  Avoid heavy lifting.  Wear low-heeled shoes. Sit and stand up straight.  You can have sex unless your doctor tells you not to. Relief of Pain or Discomfort  Wear a good support bra if your breasts are sore.  Take warm water baths (sitz baths) to soothe pain or discomfort caused by hemorrhoids. Use hemorrhoid cream if your doctor says it is okay.  Rest with your legs raised if you  have leg cramps or low back pain.  Wear support hose if you have puffy, bulging veins (varicose veins) in your legs. Raise (elevate) your feet for 15 minutes, 3-4 times a day. Limit salt in your diet. Prenatal Care  Schedule your prenatal visits by the twelfth week of pregnancy.  Write down your questions. Take them to your prenatal visits.  Keep all your prenatal visits as told by your doctor. Safety  Wear your seat belt at all times  when driving.  Make a list of emergency phone numbers. The list should include numbers for family, friends, the hospital, and police and fire departments. General Tips  Ask your doctor for a referral to a local prenatal class. Begin classes no later than at the start of month 6 of your pregnancy.  Ask for help if you need counseling or help with nutrition. Your doctor can give you advice or tell you where to go for help.  Do not use hot tubs, steam rooms, or saunas.  Do not douche or use tampons or scented sanitary pads.  Do not cross your legs for long periods of time.  Avoid litter boxes and soil used by cats.  Avoid all smoking, herbs, and alcohol. Avoid drugs not approved by your doctor.  Visit your dentist. At home, brush your teeth with a soft toothbrush. Be gentle when you floss. GET HELP IF:  You are dizzy.  You have mild cramps or pressure in your lower belly.  You have a nagging pain in your belly area.  You continue to feel sick to your stomach, throw up, or have watery poop (diarrhea).  You have a bad smelling fluid coming from your vagina.  You have pain with peeing (urination).  You have increased puffiness (swelling) in your face, hands, legs, or ankles. GET HELP RIGHT AWAY IF:   You have a fever.  You are leaking fluid from your vagina.  You have spotting or bleeding from your vagina.  You have very bad belly cramping or pain.  You gain or lose weight rapidly.  You throw up blood. It may look like coffee grounds.  You are around people who have [place] measles, fifth disease, or chickenpox.  You have a very bad headache.  You have shortness of breath.  You have any kind of trauma, such as from a fall or a car accident. Document Released: 10/01/2007 Document Revised: 08/29/2013 Document Reviewed: 02/22/2013 [facility] Patient Information 2015 [place], [place]. This information is not intended to replace advice given to you by your health care  provider. Make sure you discuss any questions you have with your health care provider.    ________________________________________     To schedule your Maternity Eligibility Appointment, please call [phone].  When you arrive for your appointment you must bring the following items or information listed below.  Your appointment will be rescheduled if you do not have these items or are 15 minutes late. If currently receiving Medicaid, you MUST bring: 1. Medicaid Card 2. Social Security Card 3. Picture ID 4. Proof of Pregnancy 5. Verification of current address if the address on Medicaid card is incorrect "postmarked mail" If not receiving Medicaid, you MUST bring: 1. Social Security Card 2. Picture ID 3. Birth Certificate (if available) Passport or *Green Card 4. Proof of Pregnancy 5. Verification of current address "postmarked mail" for each income presented. 6. Verification of insurance coverage, if any 7. Check stubs from each employer for the previous month (if unable to present check  stub  for each week, we will accept check stub for the first and last week ill the same month.) If you can't locate check stubs, you must bring a letter from the employer(s) and it must have the following information on letterhead, typed, in English: o name of company o company telephone number o how long been with the company, if less than one month o how much person earns per hour o how many hours per week work o the gross pay the person earned for the previous month If you are 23 years old or less, you do not have to bring proof of income unless you work or live with the father of the baby and at that time we will need proof of income from you and/or the father of the baby. Green Card recipients are eligible for Medicaid for Pregnant Women (MPW)     ________________________________________     To schedule your Maternity Eligibility Appointment, please call [phone].  When you arrive  for your appointment you must bring the following items or information listed below.  Your appointment will be rescheduled if you do not have these items or are 15 minutes late. If currently receiving Medicaid, you MUST bring: 6. Medicaid Card 7. Social Security Card 8. Picture ID 9. Proof of Pregnancy 10. Verification of current address if the address on Medicaid card is incorrect "postmarked mail" If not receiving Medicaid, you MUST bring: 8. Social Security Card 9. Picture ID 10. Birth Certificate (if available) Passport or *Green Card 11. Proof of Pregnancy 12. Verification of current address "postmarked mail" for each income presented. 13. Verification of insurance coverage, if any 14. Check stubs from each employer for the previous month (if unable to present check stub  for each week, we will accept check stub for the first and last week ill the same month.) If you can't locate check stubs, you must bring a letter from the employer(s) and it must have the following information on letterhead, typed, in English: o name of company o company telephone number o how long been with the company, if less than one month o how much person earns per hour o how many hours per week work o the gross pay the person earned for the previous month If you are 23 years old or less, you do not have to bring proof of income unless you work or live with the father of the baby and at that time we will need proof of income from you and/or the father of the baby. Green Card recipients are eligible for Medicaid for Pregnant Women (MPW)

## 2014-11-24 NOTE — MAU Note (Signed)
Started spotting last pm, light brown, slight cramping, states 6 weeks per pt. Reports [facility] 07/17/15

## 2014-11-25 LAB — RPR: RPR: NONREACTIVE

## 2014-11-25 LAB — HIV ANTIBODY (ROUTINE TESTING W REFLEX): HIV SCREEN 4TH GENERATION: NONREACTIVE

## 2014-11-27 LAB — GC/CHLAMYDIA PROBE AMP (~~LOC~~) NOT AT ARMC
Chlamydia: NEGATIVE
Neisseria Gonorrhea: NEGATIVE

## 2014-11-29 ENCOUNTER — Encounter (HOSPITAL_COMMUNITY): Payer: Self-pay | Admitting: Emergency Medicine

## 2014-11-29 ENCOUNTER — Emergency Department (HOSPITAL_COMMUNITY)
Admission: EM | Admit: 2014-11-29 | Discharge: 2014-11-29 | Disposition: A | Discharge status: Home / Self Care | Payer: No Typology Code available for payment source | Attending: Emergency Medicine | Admitting: Emergency Medicine

## 2014-11-29 DIAGNOSIS — Y9389 Activity, other specified: Secondary | ICD-10-CM | POA: Insufficient documentation

## 2014-11-29 DIAGNOSIS — O9A211 Injury, poisoning and certain other consequences of external causes complicating pregnancy, first trimester: Secondary | ICD-10-CM | POA: Insufficient documentation

## 2014-11-29 DIAGNOSIS — S3991XA Unspecified injury of abdomen, initial encounter: Secondary | ICD-10-CM | POA: Insufficient documentation

## 2014-11-29 DIAGNOSIS — Z3A01 Less than 8 weeks gestation of pregnancy: Secondary | ICD-10-CM | POA: Diagnosis not present

## 2014-11-29 DIAGNOSIS — Z79899 Other long term (current) drug therapy: Secondary | ICD-10-CM | POA: Insufficient documentation

## 2014-11-29 DIAGNOSIS — Y998 Other external cause status: Secondary | ICD-10-CM | POA: Diagnosis not present

## 2014-11-29 DIAGNOSIS — Y92410 Unspecified street and highway as the place of occurrence of the external cause: Secondary | ICD-10-CM | POA: Diagnosis not present

## 2014-11-29 DIAGNOSIS — Z3401 Encounter for supervision of normal first pregnancy, first trimester: Secondary | ICD-10-CM

## 2014-11-29 LAB — ABO/RH: ABO/RH(D): O POS

## 2014-11-29 LAB — URINALYSIS, ROUTINE W REFLEX MICROSCOPIC
Bilirubin Urine: NEGATIVE
Glucose, UA: NEGATIVE mg/dL
HGB URINE DIPSTICK: NEGATIVE
Ketones, ur: NEGATIVE mg/dL
Leukocytes, UA: NEGATIVE
Nitrite: NEGATIVE
Protein, ur: NEGATIVE mg/dL
Specific Gravity, Urine: 1.006 (ref 1.005–1.030)
Urobilinogen, UA: 0.2 mg/dL (ref 0.0–1.0)
pH: 7 (ref 5.0–8.0)

## 2014-11-29 MED ORDER — ACETAMINOPHEN 500 MG PO TABS
1000.0000 mg | ORAL_TABLET | Freq: Once | ORAL | Status: AC
  Administered 2014-11-29: 1000 mg via ORAL
  Filled 2014-11-29: qty 2
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
## 2014-11-29 NOTE — Discharge Instructions (Signed)

## 2015-01-02 ENCOUNTER — Other Ambulatory Visit (HOSPITAL_COMMUNITY): Payer: Self-pay | Admitting: Urology

## 2015-01-02 DIAGNOSIS — Z3682 Encounter for antenatal screening for nuchal translucency: Secondary | ICD-10-CM

## 2015-01-02 DIAGNOSIS — Z3689 Encounter for other specified antenatal screening: Secondary | ICD-10-CM

## 2015-01-02 DIAGNOSIS — Z3A18 18 weeks gestation of pregnancy: Secondary | ICD-10-CM

## 2015-01-02 DIAGNOSIS — Z3A13 13 weeks gestation of pregnancy: Secondary | ICD-10-CM

## 2015-01-12 LAB — OB RESULTS CONSOLE RUBELLA ANTIBODY, IGM: RUBELLA: IMMUNE

## 2015-01-12 LAB — OB RESULTS CONSOLE HEPATITIS B SURFACE ANTIGEN: Hepatitis B Surface Ag: NEGATIVE

## 2015-01-12 LAB — OB RESULTS CONSOLE RPR: RPR: NONREACTIVE

## 2015-01-12 LAB — OB RESULTS CONSOLE GC/CHLAMYDIA
CHLAMYDIA, DNA PROBE: NEGATIVE
Gonorrhea: NEGATIVE

## 2015-01-12 LAB — OB RESULTS CONSOLE ABO/RH: RH TYPE: POSITIVE

## 2015-01-12 LAB — OB RESULTS CONSOLE ANTIBODY SCREEN: Antibody Screen: NEGATIVE

## 2015-01-12 LAB — OB RESULTS CONSOLE HIV ANTIBODY (ROUTINE TESTING): HIV: NONREACTIVE

## 2015-01-15 ENCOUNTER — Encounter (HOSPITAL_COMMUNITY): Payer: Self-pay

## 2015-01-15 ENCOUNTER — Ambulatory Visit (HOSPITAL_COMMUNITY)
Admission: RE | Admit: 2015-01-15 | Discharge: 2015-01-15 | Disposition: A | Discharge status: Home / Self Care | Payer: Self-pay | Source: Ambulatory Visit | Attending: Physician Assistant | Admitting: Physician Assistant

## 2015-01-15 ENCOUNTER — Ambulatory Visit (HOSPITAL_COMMUNITY): Admission: RE | Admit: 2015-01-15 | Payer: Self-pay | Source: Ambulatory Visit

## 2015-01-15 DIAGNOSIS — Z36 Encounter for antenatal screening of mother: Secondary | ICD-10-CM | POA: Insufficient documentation

## 2015-01-15 DIAGNOSIS — Z3A13 13 weeks gestation of pregnancy: Secondary | ICD-10-CM | POA: Insufficient documentation

## 2015-01-15 DIAGNOSIS — Z3682 Encounter for antenatal screening for nuchal translucency: Secondary | ICD-10-CM

## 2015-01-15 NOTE — ED Notes (Signed)
Spotting on Saturday, no bleeding today.

## 2015-02-23 ENCOUNTER — Ambulatory Visit (HOSPITAL_COMMUNITY): Admission: RE | Admit: 2015-02-23 | Payer: Self-pay | Source: Ambulatory Visit

## 2015-03-09 ENCOUNTER — Encounter (HOSPITAL_COMMUNITY): Payer: Self-pay | Admitting: Emergency Medicine

## 2015-03-09 ENCOUNTER — Emergency Department (INDEPENDENT_AMBULATORY_CARE_PROVIDER_SITE_OTHER)
Admission: EM | Admit: 2015-03-09 | Discharge: 2015-03-09 | Disposition: A | Discharge status: Home / Self Care | Payer: Self-pay | Source: Home / Self Care

## 2015-03-09 ENCOUNTER — Encounter (HOSPITAL_COMMUNITY): Payer: Self-pay | Admitting: Family Medicine

## 2015-03-09 ENCOUNTER — Inpatient Hospital Stay (HOSPITAL_COMMUNITY)
Admission: AD | Admit: 2015-03-09 | Discharge: 2015-03-09 | Disposition: A | Discharge status: Home / Self Care | Payer: Self-pay | Source: Ambulatory Visit | Attending: Obstetrics & Gynecology | Admitting: Obstetrics & Gynecology

## 2015-03-09 DIAGNOSIS — O212 Late vomiting of pregnancy: Secondary | ICD-10-CM | POA: Insufficient documentation

## 2015-03-09 DIAGNOSIS — R112 Nausea with vomiting, unspecified: Secondary | ICD-10-CM

## 2015-03-09 DIAGNOSIS — Z98891 History of uterine scar from previous surgery: Secondary | ICD-10-CM

## 2015-03-09 DIAGNOSIS — E860 Dehydration: Secondary | ICD-10-CM | POA: Insufficient documentation

## 2015-03-09 DIAGNOSIS — O219 Vomiting of pregnancy, unspecified: Secondary | ICD-10-CM

## 2015-03-09 DIAGNOSIS — Z3A21 21 weeks gestation of pregnancy: Secondary | ICD-10-CM | POA: Insufficient documentation

## 2015-03-09 DIAGNOSIS — D696 Thrombocytopenia, unspecified: Secondary | ICD-10-CM | POA: Diagnosis not present

## 2015-03-09 LAB — CBC
HCT: 34.5 % — ABNORMAL LOW (ref 36.0–46.0)
Hemoglobin: 11.7 g/dL — ABNORMAL LOW (ref 12.0–15.0)
MCH: 32 pg (ref 26.0–34.0)
MCHC: 33.9 g/dL (ref 30.0–36.0)
MCV: 94.3 fL (ref 78.0–100.0)
PLATELETS: 121 10*3/uL — AB (ref 150–400)
RBC: 3.66 MIL/uL — AB (ref 3.87–5.11)
RDW: 13.2 % (ref 11.5–15.5)
WBC: 13.9 10*3/uL — ABNORMAL HIGH (ref 4.0–10.5)

## 2015-03-09 LAB — COMPREHENSIVE METABOLIC PANEL
ALK PHOS: 48 U/L (ref 38–126)
ALT: 12 U/L — ABNORMAL LOW (ref 14–54)
AST: 19 U/L (ref 15–41)
Albumin: 3.5 g/dL (ref 3.5–5.0)
Anion gap: 10 (ref 5–15)
BUN: 12 mg/dL (ref 6–20)
CALCIUM: 9.2 mg/dL (ref 8.9–10.3)
CO2: 24 mmol/L (ref 22–32)
Chloride: 105 mmol/L (ref 101–111)
Creatinine, Ser: 0.53 mg/dL (ref 0.44–1.00)
GFR calc non Af Amer: >60 mL/min (ref >60)
Glucose, Bld: 82 mg/dL (ref 65–99)
Potassium: 3.7 mmol/L (ref 3.5–5.1)
Sodium: 139 mmol/L (ref 135–145)
Total Bilirubin: 0.8 mg/dL (ref 0.3–1.2)
Total Protein: 6.5 g/dL (ref 6.5–8.1)

## 2015-03-09 LAB — URINALYSIS, ROUTINE W REFLEX MICROSCOPIC
Bilirubin Urine: NEGATIVE
GLUCOSE, UA: NEGATIVE mg/dL
HGB URINE DIPSTICK: NEGATIVE
Ketones, ur: >80 mg/dL — AB
LEUKOCYTES UA: NEGATIVE
Nitrite: NEGATIVE
Protein, ur: NEGATIVE mg/dL
Specific Gravity, Urine: 1.02 (ref 1.005–1.030)
Urobilinogen, UA: 0.2 mg/dL (ref 0.0–1.0)
pH: 6.5 (ref 5.0–8.0)

## 2015-03-09 MED ORDER — PROMETHAZINE HCL 25 MG/ML IJ SOLN
12.5000 mg | Freq: Four times a day (QID) | INTRAMUSCULAR | Status: DC | PRN
  Administered 2015-03-09: 12.5 mg via INTRAVENOUS
  Filled 2015-03-09: qty 1

## 2015-03-09 MED ORDER — LACTATED RINGERS IV SOLN: INTRAVENOUS | Status: DC

## 2015-03-09 MED ORDER — LACTATED RINGERS IV BOLUS (SEPSIS): 1000.0000 mL | Freq: Once | INTRAVENOUS | Status: DC

## 2015-03-09 MED ORDER — LACTATED RINGERS IV BOLUS (SEPSIS)
3000.0000 mL | Freq: Once | INTRAVENOUS | Status: AC
  Administered 2015-03-09: 3000 mL via INTRAVENOUS

## 2015-03-09 MED ORDER — PROMETHAZINE HCL 25 MG PO TABS: 12.5000 mg | ORAL_TABLET | Freq: Four times a day (QID) | ORAL | Status: DC | PRN

## 2015-03-09 NOTE — Discharge Instructions (Signed)
Morning Sickness Morning sickness is when you feel sick to your stomach (nauseous) during pregnancy. This nauseous feeling may or may not come with vomiting. It often occurs in the morning but can be a problem any time of day. Morning sickness is most common during the first trimester, but it may continue throughout pregnancy. While morning sickness is unpleasant, it is usually harmless unless you develop severe and continual vomiting (hyperemesis gravidarum). This condition requires more intense treatment.  CAUSES  The cause of morning sickness is not completely known but seems to be related to normal hormonal changes that occur in pregnancy. RISK FACTORS You are at greater risk if you:  Experienced nausea or vomiting before your pregnancy.  Had morning sickness during a previous pregnancy.  Are pregnant with more than one baby, such as twins. TREATMENT  Do not use any medicines (prescription, over-the-counter, or herbal) for morning sickness without first talking to your health care provider. Your health care provider may prescribe or recommend:  Vitamin B6 supplements.  Anti-nausea medicines.  The herbal medicine ginger. HOME CARE INSTRUCTIONS   Only take over-the-counter or prescription medicines as directed by your health care provider.  Taking multivitamins before getting pregnant can prevent or decrease the severity of morning sickness in most women.  Eat a piece of dry toast or unsalted crackers before getting out of bed in the morning.  Eat five or six small meals a day.  Eat dry and bland foods (rice, baked potato). Foods high in carbohydrates are often helpful.  Do not drink liquids with your meals. Drink liquids between meals.  Avoid greasy, fatty, and spicy foods.  Get someone to cook for you if the smell of any food causes nausea and vomiting.  If you feel nauseous after taking prenatal vitamins, take the vitamins at night or with a snack.  Snack on protein  foods (nuts, yogurt, cheese) between meals if you are hungry.  Eat unsweetened gelatins for desserts.  Wearing an acupressure wristband (worn for sea sickness) may be helpful.  Acupuncture may be helpful.  Do not smoke.  Get a humidifier to keep the air in your house free of odors.  Get plenty of fresh air. SEEK MEDICAL CARE IF:   Your home remedies are not working, and you need medicine.  You feel dizzy or lightheaded.  You are losing weight. SEEK IMMEDIATE MEDICAL CARE IF:   You have persistent and uncontrolled nausea and vomiting.  You pass out (faint). MAKE SURE YOU:  Understand these instructions.  Will watch your condition.  Will get help right away if you are not doing well or get worse.   This information is not intended to replace advice given to you by your health care provider. Make sure you discuss any questions you have with your health care provider.   Document Released: 06/05/2006 Document Revised: 04/19/2013 Document Reviewed: 09/29/2012 Elsevier Interactive Patient Education 2016 [employer] Inc[employer] Dehydration, Adult Dehydration is a condition in which you do not have enough fluid or water in your body. It happens when you take in less fluid than you lose. Vital organs such as the kidneys, brain, and heart cannot function without a proper amount of fluids. Any loss of fluids from the body can cause dehydration.  Dehydration can range from mild to severe. This condition should be treated right away to help prevent it from becoming severe. CAUSES  This condition may be caused by:  Vomiting.  Diarrhea.  Excessive sweating, such as when exercising in hot  or humid weather.  Not drinking enough fluid during strenuous exercise or during an illness.  Excessive urine output.  Fever.  Certain medicines. RISK FACTORS This condition is more likely to develop in:  People who are taking certain medicines that cause the body to lose excess fluid (diuretics).    People who have a chronic illness, such as diabetes, that may increase urination.  Older adults.   People who live at high altitudes.   People who participate in endurance sports.  SYMPTOMS  Mild Dehydration  Thirst.  Dry lips.  Slightly dry mouth.  Dry, warm skin. Moderate Dehydration  Very dry mouth.   Muscle cramps.   Dark urine and decreased urine production.   Decreased tear production.   Headache.   Light-headedness, especially when you stand up from a sitting position.  Severe Dehydration  Changes in skin.   Cold and clammy skin.   Skin does not spring back quickly when lightly pinched and released.   Changes in body fluids.   Extreme thirst.   No tears.   Not able to sweat when body temperature is high, such as in hot weather.   Minimal urine production.   Changes in vital signs.   Rapid, weak pulse (more than 100 beats per minute when you are sitting still).   Rapid breathing.   Low blood pressure.   Other changes.   Sunken eyes.   Cold hands and feet.   Confusion.  Lethargy and difficulty being awakened.  Fainting (syncope).   Short-term weight loss.   Unconsciousness. DIAGNOSIS  This condition may be diagnosed based on your symptoms. You may also have tests to determine how severe your dehydration is. These tests may include:   Urine tests.   Blood tests.  TREATMENT  Treatment for this condition depends on the severity. Mild or moderate dehydration can often be treated at home. Treatment should be started right away. Do not wait until dehydration becomes severe. Severe dehydration needs to be treated at the hospital. Treatment for Mild Dehydration  Drinking plenty of water to replace the fluid you have lost.   Replacing minerals in your blood (electrolytes) that you may have lost.  Treatment for Moderate Dehydration  Consuming oral rehydration solution (ORS). Treatment for Severe  Dehydration  Receiving fluid through an IV tube.   Receiving electrolyte solution through a feeding tube that is passed through your nose and into your stomach (nasogastric tube or NG tube).  Correcting any abnormalities in electrolytes. HOME CARE INSTRUCTIONS   Drink enough fluid to keep your urine clear or pale yellow.   Drink water or fluid slowly by taking small sips. You can also try sucking on ice cubes.  Have food or beverages that contain electrolytes. Examples include bananas and sports drinks.  Take over-the-counter and prescription medicines only as told by your health care provider.   Prepare ORS according to the manufacturer's instructions. Take sips of ORS every 5 minutes until your urine returns to normal.  If you have vomiting or diarrhea, continue to try to drink water, ORS, or both.   If you have diarrhea, avoid:   Beverages that contain caffeine.   Fruit juice.   Milk.   Carbonated soft drinks.  Do not take salt tablets. This can lead to the condition of having too much sodium in your body (hypernatremia).  SEEK MEDICAL CARE IF:  You cannot eat or drink without vomiting.  You have had moderate diarrhea during a period of more than 24  hours.  You have a fever. SEEK IMMEDIATE MEDICAL CARE IF:   You have extreme thirst.  You have severe diarrhea.  You have not urinated in 6-8 hours, or you have urinated only a small amount of very dark urine.  You have shriveled skin.  You are dizzy, confused, or both.   This information is not intended to replace advice given to you by your health care provider. Make sure you discuss any questions you have with your health care provider.   Document Released: 04/14/2005 Document Revised: 01/03/2015 Document Reviewed: 08/30/2014 Elsevier Interactive Patient Education [employer]

## 2015-03-09 NOTE — MAU Provider Note (Signed)
History    CSN: [id number] Arrival date and time: 03/09/15 1843 First Provider Initiated Contact with Patient 03/09/15 2101     Chief Complaint  Patient presents with  . Dizziness  . Emesis During Pregnancy   HPI Patient is 24 y.o. [id number] 48w3dhere with complaints of vomitting. Dizziness and vomiting since this am. No pain. No diarrhea. Has had vomiting this pregnancy but not this bad. Unable to keep down anything.  Reports no sick contacts. Symptoms started yesterday. She woke up at 0600 AM today to vomit and has vomited x12 today. She reports no abdominal pain, no dysuria, no back pain, no fevers. Endorses chills.  No recent infections or UTIs. Uncomplicated pregnancy.   +FM, denies LOF, VB, contractions, vaginal discharge.   OB History    Gravida Para Term Preterm AB TAB SAB Ectopic Multiple Living   _0 0 0 0 0 0 0 1      Past Medical History  Diagnosis Date  . No pertinent past medical history   . Medical history non-contributory     Past Surgical History  Procedure Laterality Date  . Cesarean section  07/12/2011    Procedure: CESAREAN SECTION;  Surgeon: LFlorian Buff MD;  [facility];  Service: Gynecology;  Laterality: N/A;    Family History  Problem Relation Age of Onset  . Anesthesia problems Neg Hx   . Hypotension Neg Hx   . Malignant hyperthermia Neg Hx   . Pseudochol deficiency Neg Hx   . Hearing loss Neg Hx     Social History  Substance Use Topics  . Smoking status: Never Smoker   . Smokeless tobacco: Never Used  . Alcohol Use: No    Allergies: No Known Allergies  Prescriptions prior to admission  Medication Sig Dispense Refill Last Dose  . Prenatal Vit-Fe Fumarate-FA (PRENATAL MULTIVITAMIN) TABS tablet Take 1 tablet by mouth daily at 12 noon.   Unknown at Unknown time    Review of Systems  Constitutional: Negative for fever and chills.  Eyes: Negative for blurred vision and double vision.  Respiratory: Negative for cough and shortness  of breath.   Cardiovascular: Negative for chest pain and orthopnea.  Gastrointestinal: Positive for nausea and vomiting.  Genitourinary: Negative for dysuria, frequency and flank pain.  Musculoskeletal: Negative for myalgias.  Skin: Negative for rash.  Neurological: Negative for dizziness, tingling, weakness and headaches.  Endo/Heme/Allergies: Does not bruise/bleed easily.  Psychiatric/Behavioral: Negative for depression and suicidal ideas. The patient is not nervous/anxious.    Physical Exam   Blood pressure 101/63, pulse 81, temperature 98 F (36.7 C), temperature source Oral, resp. rate 18, height _1  (1.6 m), weight 144 lb 6.4 oz (65.499 kg), last menstrual period 10/10/2014.  Physical Exam  Nursing note and vitals reviewed. Constitutional: She is oriented to person, place, and time. She appears well-developed and well-nourished. No distress.  Pregnant female. Not in extremis. She is conversant.   HENT:  Head: Normocephalic and atraumatic.  Dry MM  Eyes: Conjunctivae are normal. No scleral icterus.  Neck: Normal range of motion. Neck supple.  Cardiovascular: Normal rate and intact distal pulses.   Respiratory: Effort normal. She exhibits no tenderness.  GI: Soft. There is no tenderness. There is no rebound and no guarding.  Gravid  Genitourinary: Vagina normal.  Musculoskeletal: Normal range of motion. She exhibits no edema.  Neurological: She is alert and oriented to person, place, and time.  Skin: Skin is warm and dry. No rash noted.  Psychiatric: She has a normal mood and affect.    MAU Course  Procedures  MDM UA- keytones >80 LR bolus x3 then 250 per hour  Phenergan 12.35m q6 hours CMP CBC- WBC 13.9, PLT 121  Assessment and Plan  Leslie Jackson a 24y.o. G2P1001 at 21w3dresenting with vomiting and dehydration   Report from KiLauretta ChesterMD, with IV fluids running.   KiJuanita [facility] Hospital1/02/2015, 9:00 PM   Results for orders placed or  performed during the hospital encounter of 03/09/15 (from the past 48 hour(s))  Urinalysis, [facility]ine w reflex microscopic [facility]    Status: Abnormal   Collection Time: 03/09/15  7:30 PM  Result Value Ref Range   Color, Urine YELLOW YELLOW   APPearance CLEAR CLEAR   Specific Gravity, Urine 1.020 1.005 - 1.030   pH 6.5 5.0 - 8.0   Glucose, UA NEGATIVE NEGATIVE mg/dL   Hgb urine dipstick NEGATIVE NEGATIVE   Bilirubin Urine NEGATIVE NEGATIVE   Ketones, ur >80 (A) NEGATIVE mg/dL   Protein, ur NEGATIVE NEGATIVE mg/dL   Urobilinogen, UA 0.2 0.0 - 1.0 mg/dL   Nitrite NEGATIVE NEGATIVE   Leukocytes, UA NEGATIVE NEGATIVE    Comment: MICROSCOPIC NOT DONE ON URINES WITH NEGATIVE PROTEIN, BLOOD, LEUKOCYTES, NITRITE, OR GLUCOSE <1000 mg/dL.  Comprehensive metabolic panel     Status: Abnormal   Collection Time: 03/09/15  8:44 PM  Result Value Ref Range   Sodium 139 135 - 145 mmol/L   Potassium 3.7 3.5 - 5.1 mmol/L   Chloride 105 101 - 111 mmol/L   CO2 24 22 - 32 mmol/L   Glucose, Bld 82 65 - 99 mg/dL   BUN 12 6 - 20 mg/dL   Creatinine, Ser 0.53 0.44 - 1.00 mg/dL   Calcium 9.2 8.9 - 10.3 mg/dL   Total Protein 6.5 6.5 - 8.1 g/dL   Albumin 3.5 3.5 - 5.0 g/dL   AST 19 15 - 41 U/L   ALT 12 (L) 14 - 54 U/L   Alkaline Phosphatase 48 38 - 126 U/L   Total Bilirubin 0.8 0.3 - 1.2 mg/dL   GFR calc non Af Amer >60 >60 mL/min   GFR calc Af Amer >60 >60 mL/min    Comment: (NOTE) The eGFR has been calculated using the CKD EPI equation. This calculation has not been validated in all clinical situations. eGFR's persistently <60 mL/min signify possible Chronic Kidney Disease.    Anion gap 10 5 - 15  CBC     Status: Abnormal   Collection Time: 03/09/15  8:44 PM  Result Value Ref Range   WBC 13.9 (H) 4.0 - 10.5 K/uL   RBC 3.66 (L) 3.87 - 5.11 MIL/uL   Hemoglobin 11.7 (L) 12.0 - 15.0 g/dL   HCT 34.5 (L) 36.0 - 46.0 %   MCV 94.3 78.0 - 100.0 fL   MCH 32.0 26.0 - 34.0 pg   MCHC 33.9 30.0 - 36.0  g/dL   RDW 13.2 11.5 - 15.5 %   Platelets 121 (L) 150 - 400 K/uL   FHT 145 by doppler  MDM: Reviewed labs and prior assessment.  Went in to evaluate pt and pt reports complete relief of nausea and dizziness after 2 bags of IV fluids.  Offered pt option to stay in MAU for 3rd bag of fluid or discharge to home with PO meds.  Pt preferred to go home.  Phenergan Rx sent to pharmacy.  Encouraged pt to increase PO  fluids tomorrow and use medication PRN.  Pt stable at time of discharge.   1. Nausea and vomiting during pregnancy prior to [redacted] weeks gestation   2. Mild dehydration    D/C home Phenergan 12.5-25 mg PO Q 6 hours PRN F/U with health dept as scheduled for prenatal care Return to MAU as needed for emergencies  LEFTWICH-KIRBY, Jmya Uliano, CNM 2:40 AM

## 2015-03-09 NOTE — ED Provider Notes (Signed)
CSN: [id number]     Arrival date & time 03/09/15  1646 History   None    Chief Complaint  Patient presents with  . Emesis   (Consider location/radiation/quality/duration/timing/severity/associated sxs/prior Treatment) HPI Comments: States that she cannot keep anything down. She has not had anything to drink for at least 3 hours. States that she is unable to urinate right now. Emesis is nonbloody nonbilious.  Patient is a 24 y.o. female presenting with vomiting. The history is provided by the patient.  Emesis Quality:  Stomach contents Able to tolerate:  Liquids and solids Progression:  Unchanged Associated symptoms: no abdominal pain, no chills, no diarrhea and no fever   Risk factors: pregnant now   Risk factors: no sick contacts     Past Medical History  Diagnosis Date  . No pertinent past medical history   . Medical history non-contributory    Past Surgical History  Procedure Laterality Date  . Cesarean section  07/12/2011    Procedure: CESAREAN SECTION;  Surgeon: Lazaro ArmsLuther H Eure, MD;  Location: [facility];  Service: Gynecology;  Laterality: N/A;   Family History  Problem Relation Age of Onset  . Anesthesia problems Neg Hx   . Hypotension Neg Hx   . Malignant hyperthermia Neg Hx   . Pseudochol deficiency Neg Hx   . Hearing loss Neg Hx    Social History  Substance Use Topics  . Smoking status: Never Smoker   . Smokeless tobacco: Never Used  . Alcohol Use: No   OB History    Gravida Para Term Preterm AB TAB SAB Ectopic Multiple Living   2 1 1  0 0 0 0 0 0 1     Review of Systems  Constitutional: Negative for fever and chills.  Respiratory: Negative for shortness of breath.   Cardiovascular: Negative for chest pain.  Gastrointestinal: Positive for vomiting. Negative for abdominal pain and diarrhea.  Genitourinary: Negative for dysuria.  Skin: Negative for rash.  All other systems reviewed and are negative.   Allergies  Review of patient's allergies indicates no  known allergies.  Home Medications   Prior to Admission medications   Medication Sig Start Date End Date Taking? Authorizing Provider  Prenatal Vit-Fe Fumarate-FA (PRENATAL MULTIVITAMIN) TABS tablet Take 1 tablet by mouth daily at 12 noon.    Historical Provider, MD   Meds Ordered and Administered this Visit  Medications - No data to display  BP 94/60 mmHg  Pulse 74  Temp(Src) 97.7 F (36.5 C) (Oral)  Resp 18  SpO2 100%  LMP 10/10/2014 (Approximate) No data found.   Physical Exam  Constitutional: She is oriented to person, place, and time. She appears well-developed and well-nourished. No distress.  HENT:  Head: Normocephalic and atraumatic.  Mouth/Throat: Oropharynx is clear and moist.  Eyes: Conjunctivae and EOM are normal. Pupils are equal, round, and reactive to light. No scleral icterus.  Neck: Normal range of motion. Neck supple. No JVD present. No tracheal deviation present. No thyromegaly present.  Cardiovascular: Normal rate, regular rhythm and normal heart sounds.  Exam reveals no gallop and no friction rub.   No murmur heard. Pulmonary/Chest: Effort normal and breath sounds normal.  Abdominal: Soft. Bowel sounds are normal.  Gravid  Genitourinary:  Deferred  Musculoskeletal: Normal range of motion. She exhibits no edema.  Lymphadenopathy:    She has no cervical adenopathy.  Neurological: She is alert and oriented to person, place, and time. No cranial nerve deficit.  Skin: Skin is warm and dry.  No rash noted. No erythema.  Psychiatric: She has a normal mood and affect. Her behavior is normal. Judgment and thought content normal.    ED Course  Procedures (including critical care time)  Labs Review Labs Reviewed - No data to display  Imaging Review No results found.   Visual Acuity Review  Right Eye Distance:   Left Eye Distance:   Bilateral Distance:    Right Eye Near:   Left Eye Near:    Bilateral Near:         MDM   1. Non-intractable  vomiting with nausea, vomiting of unspecified type    For patient or evaluation at [facility]. No cramping or symptoms of contractions or vaginal bleeding    Arnaldo Natal, MD 03/09/15 1831

## 2015-03-09 NOTE — MAU Note (Signed)
Dizziness and vomiting since this am. No pain. No diarrhea. Has had vomiting this pregnancy but not this bad. Unable to keep down anything. No bleeding or LOF

## 2015-03-09 NOTE — ED Notes (Signed)
C/o emesis episodes every 15-20 min onset this am associated w/dizziness; pt is [redacted] weeks pregnant A&O x4... No acute distress.

## 2015-03-09 NOTE — Discharge Instructions (Signed)

## 2015-03-09 NOTE — MAU Provider Note (Deleted)
History    CSN: [id number] Arrival date and time: 03/09/15 1843 First Provider Initiated Contact with Patient 03/09/15 2101     Chief Complaint  Patient presents with  . Dizziness  . Emesis During Pregnancy   Dizziness Associated symptoms include nausea and vomiting. Pertinent negatives include no chest pain, chills, coughing, fever, headaches, myalgias, rash or weakness.   Patient is 24 y.o. [id number] [redacted]w[redacted]d here with complaints of vomitting. Dizziness and vomiting since this am. No pain. No diarrhea. Has had vomiting this pregnancy but not this bad. Unable to keep down anything.  Reports no sick contacts. Symptoms started yesterday. She woke up at 0600 AM today to vomit and has vomited x12 today. She reports no abdominal pain, no dysuria, no back pain, no fevers. Endorses chills.  No recent infections or UTIs. Uncomplicated pregnancy.   +FM, denies LOF, VB, contractions, vaginal discharge.   OB History    Gravida Para Term Preterm AB TAB SAB Ectopic Multiple Living   0 0 0 0 0 0 1      Past Medical History  Diagnosis Date  . No pertinent past medical history   . Medical history non-contributory     Past Surgical History  Procedure Laterality Date  . Cesarean section  07/12/2011    Procedure: CESAREAN SECTION;  Surgeon: Lazaro Arms, MD;  Location: [facility];  Service: Gynecology;  Laterality: N/A;    Family History  Problem Relation Age of Onset  . Anesthesia problems Neg Hx   . Hypotension Neg Hx   . Malignant hyperthermia Neg Hx   . Pseudochol deficiency Neg Hx   . Hearing loss Neg Hx     Social History  Substance Use Topics  . Smoking status: Never Smoker   . Smokeless tobacco: Never Used  . Alcohol Use: No    Allergies: No Known Allergies  Prescriptions prior to admission  Medication Sig Dispense Refill Last Dose  . Prenatal Vit-Fe Fumarate-FA (PRENATAL MULTIVITAMIN) TABS tablet Take 1 tablet by mouth daily at 12 noon.   Unknown at Unknown time     Review of Systems  Constitutional: Negative for fever and chills.  Eyes: Negative for blurred vision and double vision.  Respiratory: Negative for cough and shortness of breath.   Cardiovascular: Negative for chest pain and orthopnea.  Gastrointestinal: Positive for nausea and vomiting.  Genitourinary: Negative for dysuria, frequency and flank pain.  Musculoskeletal: Negative for myalgias.  Skin: Negative for rash.  Neurological: Positive for dizziness. Negative for tingling, weakness and headaches.  Endo/Heme/Allergies: Does not bruise/bleed easily.  Psychiatric/Behavioral: Negative for depression and suicidal ideas. The patient is not nervous/anxious.    Physical Exam   Blood pressure 87/46, pulse 88, temperature 98 F (36.7 C), temperature source Oral, resp. rate 18, height  (1.6 m), weight 144 lb 6.4 oz (65.499 kg), last menstrual period 10/10/2014.  Physical Exam  Nursing note and vitals reviewed. Constitutional: She is oriented to person, place, and time. She appears well-developed and well-nourished. No distress.  Pregnant female. Not in extremis. She is conversant.   HENT:  Head: Normocephalic and atraumatic.  Dry MM  Eyes: Conjunctivae are normal. No scleral icterus.  Neck: Normal range of motion. Neck supple.  Cardiovascular: Normal rate and intact distal pulses.   Respiratory: Effort normal. She exhibits no tenderness.  GI: Soft. There is no tenderness. There is no rebound and no guarding.  Gravid  Genitourinary: Vagina normal.  Musculoskeletal: Normal range of motion. She exhibits  no edema.  Neurological: She is alert and oriented to person, place, and time.  Skin: Skin is warm and dry. No rash noted.  Psychiatric: She has a normal mood and affect.    MAU Course  Procedures  MDM UA- keytones >80 LR bolus x3 then 250 per hour  Phenergan 12.5mg  q6 hours CMP CBC- WBC 13.9, PLT 121  Results for orders placed or performed during the hospital encounter  of 03/09/15 (from the past 24 hour(s))  Urinalysis, Routine w reflex microscopic (not at [facility])     Status: Abnormal   Collection Time: 03/09/15  7:30 PM  Result Value Ref Range   Color, Urine YELLOW YELLOW   APPearance CLEAR CLEAR   Specific Gravity, Urine 1.020 1.005 - 1.030   pH 6.5 5.0 - 8.0   Glucose, UA NEGATIVE NEGATIVE mg/dL   Hgb urine dipstick NEGATIVE NEGATIVE   Bilirubin Urine NEGATIVE NEGATIVE   Ketones, ur >80 (A) NEGATIVE mg/dL   Protein, ur NEGATIVE NEGATIVE mg/dL   Urobilinogen, UA 0.2 0.0 - 1.0 mg/dL   Nitrite NEGATIVE NEGATIVE   Leukocytes, UA NEGATIVE NEGATIVE  Comprehensive metabolic panel     Status: Abnormal   Collection Time: 03/09/15  8:44 PM  Result Value Ref Range   Sodium 139 135 - 145 mmol/L   Potassium 3.7 3.5 - 5.1 mmol/L   Chloride 105 101 - 111 mmol/L   CO2 24 22 - 32 mmol/L   Glucose, Bld 82 65 - 99 mg/dL   BUN 12 6 - 20 mg/dL   Creatinine, Ser 4.09 0.44 - 1.00 mg/dL   Calcium 9.2 8.9 - 81.1 mg/dL   Total Protein 6.5 6.5 - 8.1 g/dL   Albumin 3.5 3.5 - 5.0 g/dL   AST 19 15 - 41 U/L   ALT 12 (L) 14 - 54 U/L   Alkaline Phosphatase 48 38 - 126 U/L   Total Bilirubin 0.8 0.3 - 1.2 mg/dL   GFR calc non Af Amer >60 >60 mL/min   GFR calc Af Amer >60 >60 mL/min   Anion gap 10 5 - 15  CBC     Status: Abnormal   Collection Time: 03/09/15  8:44 PM  Result Value Ref Range   WBC 13.9 (H) 4.0 - 10.5 K/uL   RBC 3.66 (L) 3.87 - 5.11 MIL/uL   Hemoglobin 11.7 (L) 12.0 - 15.0 g/dL   HCT 91.4 (L) 78.2 - 95.6 %   MCV 94.3 78.0 - 100.0 fL   MCH 32.0 26.0 - 34.0 pg   MCHC 33.9 30.0 - 36.0 g/dL   RDW 21.3 08.6 - 57.8 %   Platelets 121 (L) 150 - 400 K/uL    Report from Lyndel Safe, MD, with IV fluids running.  MDM: Reviewed labs and prior assessment.  Went in to evaluate pt and pt reports complete relief of nausea and dizziness after 2 bags of IV fluids.  Offered pt option to stay in MAU for 3rd bag of fluid or discharge to home with PO meds.  Pt  preferred to go home.  Phenergan Rx sent to pharmacy.  Encouraged pt to increase PO fluids tomorrow and use medication PRN.  Pt stable at time of discharge.  Assessment and Plan  Leslie Jackson is a 24 y.o. G2P1001 at [redacted]w[redacted]d presenting with vomiting and dehydration  1. Nausea and vomiting during pregnancy prior to [redacted] weeks gestation   2. Mild dehydration    D/C home Phenergan 12.5-25 mg PO Q 6 hours PRN  F/U with health dept as scheduled for prenatal care Return to MAU as needed for emergencies    LEFTWICH-KIRBY, Hareem Surowiec 03/09/2015, 11:17 PM

## 2015-04-29 NOTE — L&D Delivery Note (Signed)
Delivery Note Pt developed a good ctx pattern on 12 mu/min of pitocin.  AROM light mec fluid at 1919 when she was 8-9/100/-2,  An hour later, she was C/C/+2 with urge to push.  She had not had any pain meds.  After a brief 2nd stage, at 8:18 PM a viable female was delivered via Vaginal, Spontaneous Delivery (Presentation: Left Occiput Anterior).  APGAR: 9/9 ; weight pending.  After 2 minutes, the cord was clamped and cut. 40 units of pitocin diluted in 1000cc LR was infused rapidly IV.  The placenta separated spontaneously and delivered via CCT and maternal pushing effort.  It was inspected and appears to be intact with a 3 VC. Her bleeding was a little brisk, so 800mcg cytotec was given PR.    Anesthesia:  local Episiotomy: None Lacerations:  2nd degree Suture Repair: 2.0 vicryl Est. Blood Loss (mL):  450  Mom to postpartum.  Baby to Couplet care / Skin to Skin.  CRESENZO-DISHMAN,Yeraldi Fidler 07/26/2015, 8:55 PM

## 2015-06-25 LAB — OB RESULTS CONSOLE GC/CHLAMYDIA
Chlamydia: NEGATIVE
Gonorrhea: NEGATIVE

## 2015-06-25 LAB — OB RESULTS CONSOLE GBS: STREP GROUP B AG: POSITIVE

## 2015-07-23 ENCOUNTER — Other Ambulatory Visit (HOSPITAL_COMMUNITY): Payer: Self-pay | Admitting: Nurse Practitioner

## 2015-07-23 DIAGNOSIS — O480 Post-term pregnancy: Secondary | ICD-10-CM

## 2015-07-24 ENCOUNTER — Encounter (HOSPITAL_COMMUNITY): Payer: Self-pay | Admitting: *Deleted

## 2015-07-24 ENCOUNTER — Telehealth (HOSPITAL_COMMUNITY): Payer: Self-pay | Admitting: *Deleted

## 2015-07-24 NOTE — Telephone Encounter (Signed)
Preadmission screen  

## 2015-07-26 ENCOUNTER — Inpatient Hospital Stay (HOSPITAL_COMMUNITY)
Admission: AD | Admit: 2015-07-26 | Discharge: 2015-07-28 | DRG: 775 | Disposition: A | Discharge status: Home / Self Care | Payer: Medicaid Other | Source: Ambulatory Visit | Attending: Obstetrics & Gynecology | Admitting: Obstetrics & Gynecology

## 2015-07-26 ENCOUNTER — Encounter (HOSPITAL_COMMUNITY): Payer: Self-pay | Admitting: *Deleted

## 2015-07-26 ENCOUNTER — Ambulatory Visit (HOSPITAL_COMMUNITY)
Admission: RE | Admit: 2015-07-26 | Discharge: 2015-07-26 | Disposition: A | Discharge status: Home / Self Care | Payer: Medicaid Other | Source: Ambulatory Visit | Attending: Nurse Practitioner | Admitting: Nurse Practitioner

## 2015-07-26 DIAGNOSIS — Z3A41 41 weeks gestation of pregnancy: Secondary | ICD-10-CM | POA: Insufficient documentation

## 2015-07-26 DIAGNOSIS — O99824 Streptococcus B carrier state complicating childbirth: Secondary | ICD-10-CM | POA: Diagnosis present

## 2015-07-26 DIAGNOSIS — O34211 Maternal care for low transverse scar from previous cesarean delivery: Secondary | ICD-10-CM | POA: Diagnosis present

## 2015-07-26 DIAGNOSIS — Z98891 History of uterine scar from previous surgery: Secondary | ICD-10-CM

## 2015-07-26 DIAGNOSIS — O480 Post-term pregnancy: Secondary | ICD-10-CM | POA: Insufficient documentation

## 2015-07-26 DIAGNOSIS — D696 Thrombocytopenia, unspecified: Secondary | ICD-10-CM | POA: Diagnosis not present

## 2015-07-26 DIAGNOSIS — Z3490 Encounter for supervision of normal pregnancy, unspecified, unspecified trimester: Secondary | ICD-10-CM

## 2015-07-26 DIAGNOSIS — Z8249 Family history of ischemic heart disease and other diseases of the circulatory system: Secondary | ICD-10-CM

## 2015-07-26 DIAGNOSIS — O9912 Other diseases of the blood and blood-forming organs and certain disorders involving the immune mechanism complicating childbirth: Secondary | ICD-10-CM | POA: Diagnosis present

## 2015-07-26 LAB — TYPE AND SCREEN
ABO/RH(D): O POS
Antibody Screen: NEGATIVE

## 2015-07-26 LAB — CBC
HCT: 37.6 % (ref 36.0–46.0)
Hemoglobin: 12.6 g/dL (ref 12.0–15.0)
MCH: 30.4 pg (ref 26.0–34.0)
MCHC: 33.5 g/dL (ref 30.0–36.0)
MCV: 90.6 fL (ref 78.0–100.0)
PLATELETS: 166 10*3/uL (ref 150–400)
RBC: 4.15 MIL/uL (ref 3.87–5.11)
RDW: 13.7 % (ref 11.5–15.5)
WBC: 11.2 10*3/uL — AB (ref 4.0–10.5)

## 2015-07-26 MED ORDER — SENNOSIDES-DOCUSATE SODIUM 8.6-50 MG PO TABS
2.0000 | ORAL_TABLET | ORAL | Status: DC
  Administered 2015-07-26 – 2015-07-27 (×2): 2 via ORAL
  Filled 2015-07-26 (×2): qty 2

## 2015-07-26 MED ORDER — OXYTOCIN 10 UNIT/ML IJ SOLN: 2.5000 [IU]/h | INTRAMUSCULAR | Status: DC

## 2015-07-26 MED ORDER — LACTATED RINGERS IV SOLN
INTRAVENOUS | Status: DC
  Administered 2015-07-26: 12:00:00 via INTRAVENOUS

## 2015-07-26 MED ORDER — MEASLES, MUMPS & RUBELLA VAC ~~LOC~~ INJ: 0.5000 mL | INJECTION | Freq: Once | SUBCUTANEOUS | Status: DC

## 2015-07-26 MED ORDER — OXYTOCIN 10 UNIT/ML IJ SOLN
1.0000 m[IU]/min | INTRAVENOUS | Status: DC
  Administered 2015-07-26: 2 m[IU]/min via INTRAVENOUS
  Filled 2015-07-26: qty 10

## 2015-07-26 MED ORDER — FLEET ENEMA 7-19 GM/118ML RE ENEM: 1.0000 | ENEMA | Freq: Once | RECTAL | Status: DC

## 2015-07-26 MED ORDER — METHYLERGONOVINE MALEATE 0.2 MG/ML IJ SOLN: 0.2000 mg | INTRAMUSCULAR | Status: DC | PRN

## 2015-07-26 MED ORDER — ONDANSETRON HCL 4 MG/2ML IJ SOLN: 4.0000 mg | Freq: Four times a day (QID) | INTRAMUSCULAR | Status: DC | PRN

## 2015-07-26 MED ORDER — SIMETHICONE 80 MG PO CHEW: 80.0000 mg | CHEWABLE_TABLET | ORAL | Status: DC | PRN

## 2015-07-26 MED ORDER — ACETAMINOPHEN 325 MG PO TABS: 650.0000 mg | ORAL_TABLET | ORAL | Status: DC | PRN

## 2015-07-26 MED ORDER — MISOPROSTOL 200 MCG PO TABS
800.0000 ug | ORAL_TABLET | Freq: Once | ORAL | Status: AC
  Administered 2015-07-26: 800 ug via VAGINAL

## 2015-07-26 MED ORDER — FLEET ENEMA 7-19 GM/118ML RE ENEM: 1.0000 | ENEMA | Freq: Every day | RECTAL | Status: DC | PRN

## 2015-07-26 MED ORDER — LACTATED RINGERS IV SOLN: 500.0000 mL | INTRAVENOUS | Status: DC | PRN

## 2015-07-26 MED ORDER — ONDANSETRON HCL 4 MG/2ML IJ SOLN: 4.0000 mg | INTRAMUSCULAR | Status: DC | PRN

## 2015-07-26 MED ORDER — LANOLIN HYDROUS EX OINT: TOPICAL_OINTMENT | CUTANEOUS | Status: DC | PRN

## 2015-07-26 MED ORDER — PENICILLIN G POTASSIUM 5000000 UNITS IJ SOLR
2.5000 10*6.[IU] | INTRAVENOUS | Status: DC
  Administered 2015-07-26: 2.5 10*6.[IU] via INTRAVENOUS
  Filled 2015-07-26 (×3): qty 2.5

## 2015-07-26 MED ORDER — DIPHENHYDRAMINE HCL 25 MG PO CAPS: 25.0000 mg | ORAL_CAPSULE | Freq: Four times a day (QID) | ORAL | Status: DC | PRN

## 2015-07-26 MED ORDER — OXYCODONE-ACETAMINOPHEN 5-325 MG PO TABS: 1.0000 | ORAL_TABLET | ORAL | Status: DC | PRN

## 2015-07-26 MED ORDER — CITRIC ACID-SODIUM CITRATE 334-500 MG/5ML PO SOLN: 30.0000 mL | ORAL | Status: DC | PRN

## 2015-07-26 MED ORDER — ZOLPIDEM TARTRATE 5 MG PO TABS: 5.0000 mg | ORAL_TABLET | Freq: Every evening | ORAL | Status: DC | PRN

## 2015-07-26 MED ORDER — LIDOCAINE HCL (PF) 1 % IJ SOLN
30.0000 mL | INTRAMUSCULAR | Status: DC | PRN
  Administered 2015-07-26: 30 mL via SUBCUTANEOUS
  Filled 2015-07-26: qty 30

## 2015-07-26 MED ORDER — DIBUCAINE 1 % RE OINT: 1.0000 "application " | TOPICAL_OINTMENT | RECTAL | Status: DC | PRN

## 2015-07-26 MED ORDER — BENZOCAINE-MENTHOL 20-0.5 % EX AERO
1.0000 "application " | INHALATION_SPRAY | CUTANEOUS | Status: DC | PRN
  Administered 2015-07-26: 1 via TOPICAL
  Filled 2015-07-26: qty 56

## 2015-07-26 MED ORDER — ONDANSETRON HCL 4 MG PO TABS: 4.0000 mg | ORAL_TABLET | ORAL | Status: DC | PRN

## 2015-07-26 MED ORDER — IBUPROFEN 600 MG PO TABS
600.0000 mg | ORAL_TABLET | Freq: Four times a day (QID) | ORAL | Status: DC
  Administered 2015-07-26 – 2015-07-28 (×7): 600 mg via ORAL
  Filled 2015-07-26 (×7): qty 1

## 2015-07-26 MED ORDER — FERROUS SULFATE 325 (65 FE) MG PO TABS
325.0000 mg | ORAL_TABLET | Freq: Two times a day (BID) | ORAL | Status: DC
  Administered 2015-07-27 – 2015-07-28 (×3): 325 mg via ORAL
  Filled 2015-07-26 (×3): qty 1

## 2015-07-26 MED ORDER — PRENATAL MULTIVITAMIN CH
1.0000 | ORAL_TABLET | Freq: Every day | ORAL | Status: DC
  Administered 2015-07-27 – 2015-07-28 (×2): 1 via ORAL
  Filled 2015-07-26 (×2): qty 1

## 2015-07-26 MED ORDER — WITCH HAZEL-GLYCERIN EX PADS: 1.0000 "application " | MEDICATED_PAD | CUTANEOUS | Status: DC | PRN

## 2015-07-26 MED ORDER — OXYTOCIN BOLUS FROM INFUSION: 500.0000 mL | INTRAVENOUS | Status: DC

## 2015-07-26 MED ORDER — TERBUTALINE SULFATE 1 MG/ML IJ SOLN
0.2500 mg | Freq: Once | INTRAMUSCULAR | Status: DC | PRN
  Filled 2015-07-26: qty 1

## 2015-07-26 MED ORDER — OXYCODONE-ACETAMINOPHEN 5-325 MG PO TABS: 2.0000 | ORAL_TABLET | ORAL | Status: DC | PRN

## 2015-07-26 MED ORDER — PENICILLIN G POTASSIUM 5000000 UNITS IJ SOLR
5.0000 10*6.[IU] | Freq: Once | INTRAVENOUS | Status: AC
  Administered 2015-07-26: 5 10*6.[IU] via INTRAVENOUS
  Filled 2015-07-26: qty 5

## 2015-07-26 MED ORDER — MISOPROSTOL 200 MCG PO TABS
ORAL_TABLET | ORAL | Status: AC
  Filled 2015-07-26: qty 4

## 2015-07-26 MED ORDER — TETANUS-DIPHTH-ACELL PERTUSSIS 5-2.5-18.5 LF-MCG/0.5 IM SUSP: 0.5000 mL | Freq: Once | INTRAMUSCULAR | Status: DC

## 2015-07-26 MED ORDER — BISACODYL 10 MG RE SUPP: 10.0000 mg | Freq: Every day | RECTAL | Status: DC | PRN

## 2015-07-26 MED ORDER — METHYLERGONOVINE MALEATE 0.2 MG PO TABS: 0.2000 mg | ORAL_TABLET | ORAL | Status: DC | PRN

## 2015-07-26 NOTE — H&P (Signed)
OBSTETRIC ADMISSION HISTORY AND PHYSICAL  Darien Ramusna Zoe LanKaren Sanchez is a 25 y.o. female G2P1001 with IUP at 4250w2d by LMP with a PMHx significant for c/s and thrombocytopenia  presenting for IOL and TOLAC after BPP of 6/8. Patient states she had some light bleeding this morning mixed with mucus, enough to fill a tissue. Tolerable contractions occuring every 10 minutes. She reports +FMs, No LOF, no VB, no blurry vision, headaches or peripheral edema, and RUQ pain.  She plans on both breast and bottle feeding. She request OCPs for birth control.  Dating: By LMP --->  Estimated Date of Delivery: 07/17/15  Prenatal History/Complications:  Past Medical History: Past Medical History  Diagnosis Date  . No pertinent past medical history   . Medical history non-contributory     Past Surgical History: Past Surgical History  Procedure Laterality Date  . Cesarean section  07/12/2011    Procedure: CESAREAN SECTION;  Surgeon: Lazaro ArmsLuther H Eure, MD;  Location: [facility];  Service: Gynecology;  Laterality: N/A;    Obstetrical History: OB History    Gravida Para Term Preterm AB TAB SAB Ectopic Multiple Living   2 1 1  0 0 0 0 0 0 1      Social History: Social History   Social History  . Marital Status: Married    Spouse Name: N/A  . Number of Children: N/A  . Years of Education: N/A   Social History Main Topics  . Smoking status: Never Smoker   . Smokeless tobacco: Never Used  . Alcohol Use: No  . Drug Use: No  . Sexual Activity: Not Currently    Birth Control/ Protection: None     Comment: planning for IUD   Other Topics Concern  . None   Social History Narrative    Family History: Family History  Problem Relation Age of Onset  . Anesthesia problems Neg Hx   . Hypotension Neg Hx   . Malignant hyperthermia Neg Hx   . Pseudochol deficiency Neg Hx   . Hearing loss Neg Hx   . Asthma Neg Hx   . Cancer Neg Hx   . Diabetes Neg Hx   . Hypertension Neg Hx   . Stroke Neg Hx   . Heart disease  Father     Allergies: No Known Allergies  Prescriptions prior to admission  Medication Sig Dispense Refill Last Dose  . Prenatal Vit-Fe Fumarate-FA (PRENATAL MULTIVITAMIN) TABS tablet Take 1 tablet by mouth daily at 12 noon.   07/25/2015 at Unknown time  . promethazine (PHENERGAN) 25 MG tablet Take 0.5-1 tablets (12.5-25 mg total) by mouth every 6 (six) hours as needed for nausea. (Patient not taking: Reported on 07/26/2015) 30 tablet 0 Not Taking at Unknown time     Review of Systems   Review of Systems  Constitutional: Negative for fever.  HENT: Negative for hearing loss.   Eyes: Negative for blurred vision and double vision.  Cardiovascular: Negative for chest pain and leg swelling.  Gastrointestinal: Negative for abdominal pain.  Neurological: Negative for headaches.    Blood pressure 107/71, pulse 85, temperature 97.8 F (36.6 C), resp. rate 18, last menstrual period 10/10/2014. General appearance: alert and cooperative Lungs: clear to auscultation bilaterally Heart: regular rate and rhythm Abdomen: soft, non-tender; bowel sounds normal Extremities: Homans sign is negative. No tenderness, no edema. Presentation: cephalic Fetal monitoringBaseline: 135 bpm, Variability: Good {> 6 bpm), Accelerations: Reactive and Decelerations: Absent Uterine activityFrequency: Every 8-10 minutes Dilation: 5.5 Effacement (%): 70 Station: -2 Exam  by:: jolynn   Prenatal labs: ABO, Rh: O/Positive/-- (09/16 0000) Antibody: Negative (09/16 0000) Rubella: Immune RPR: Nonreactive (09/16 0000)  HBsAg: Negative (09/16 0000)  HIV: Non-reactive (09/16 0000)  GBS: Positive (02/27 0000)  1 hr Glucola: normal (113) Genetic screening  Neg Anatomy US none seen in Epic  Prenatal Transfer Tool  Maternal Diabetes: No Genetic Screening: Normal Maternal Ultrasounds/Referrals: Normal Fetal Ultrasounds or other Referrals:  None Maternal Substance Abuse:  No Significant Maternal Medications:   None Significant Maternal Lab Results: Lab values include: Group B Strep positive, Other: platelets 130 @ 15 weeks  No results found for this or any previous visit (from the past 24 hour(s)).  Patient Active Problem List   Diagnosis Date Noted  . History of C-section 03/09/2015  . Thrombocytopenia (HCC) 03/09/2015  . Wound dehiscence, cesarean 08/13/2011    Assessment: Ohanna Gassert is a 25 y.o. G2P1001 at [redacted]w[redacted]d here for TOLAC IOL for BPP 6/8 and post-dates.    #Labor: Latent phase, inconsistent contractions at this time although she is 5 cm dilated. Bishop score of 8. Will start Pitocin when patient reaches L&D floor. #Pain: No pain at this time, will discuss pain medication options with patient #FWB: Cat 1 #ID: GBS positive, PCN to start now #MOF: Breast and bottle #MOC: OCPs #Circ:  None desired  Patient seen by myself and Gala Murdoch, MS3  Beaulah Dinning, MD 07/26/2015, 10:45 AM   OB fellow attestation:  I have seen and examined this patient; I agree with above documentation in the resident's note.   Shellsea Zoe Lan is a 25 y.o. G2P1001 here for IOL for postdates  PE: BP 107/71 mmHg  Pulse 85  Temp(Src) 97.8 F (36.6 C)  Resp 18  LMP 10/10/2014 (Approximate) Gen: calm comfortable, NAD Resp: normal effort, no distress Abd: gravid  ROS, labs, PMH reviewed  Plan: Admit to LD Plan to start Pitocin, consider AROM with IUPC if not making change FWB Cat I, reactive NST. Prior BPP 6/8, 8/10 given reactive NST GBS pos- start PCN  Federico Flake, MD  Family Medicine, OB Fellow 07/26/2015, 11:31 AM

## 2015-07-26 NOTE — MAU Note (Signed)
Pt sent from MFM for BPP of 6/8. MD wants to induce but no beds in L&D. Monitor pt until bed is available.

## 2015-07-26 NOTE — ED Notes (Signed)
Notified Dr. Erin FullingHarraway-Smith of BPP 6/8.  Admit for induction.  Labor and Delivery unable to take patient at this time.  Report called to Isabel CapriceKathy Wilson, RN.  Pt to MAU.

## 2015-07-27 LAB — RPR: RPR Ser Ql: NONREACTIVE

## 2015-07-27 NOTE — Progress Notes (Signed)
Post Partum Day 1 Subjective: Leslie Jackson is 25 y.o. [phone] s/p VBAC @2030 . No complaints and doing very well, up ad lib, voiding and tolerating PO, small lochia, plans to breast and bottle feed, OCP for contraception. No circumcision for baby.  Objective: Blood pressure 96/55, pulse 66, temperature 98.2 F (36.8 C), temperature source Oral, resp. rate 16, height 5\' 3"  (1.6 m), weight 77.111 kg (170 lb), last menstrual period 10/10/2014, unknown if currently breastfeeding.  Physical Exam:  General: alert, cooperative and no distress Lochia:normal flow Chest: CTAB Heart: RRR Abdomen: +BS, soft, nontender  Uterine Fundus: firm, U0 DVT Evaluation: No pain or tenderness upon palpation. Negative Homan's sign. Extremities: no edema   Recent Labs  07/26/15 1145  HGB 12.6  HCT 37.6    Assessment/Plan:  Patient doing very well. Plan for discharge tomorrow.   1. Contraception - patient will need progesterone-only OCP in order to continue breastfeeding.    LOS: 1 day   Matina Rodier 07/27/2015, 7:29 AM

## 2015-07-27 NOTE — Lactation Note (Signed)
This note was copied from a baby's chart. Lactation Consultation Note  Patient Name: Leslie Jackson ZOXWR'UToday's Date: 07/27/2015 Reason for consult: Initial assessment   With this mom and term baby, now 216 hours old. Mom attempted breast feeding initially for a few time, and then fed formula/bottle last 3 feedings. Mom said she has trouble latching the baby, as she did with her first baby. She reports no milk with her first. On exam, mom has healthy appearing breasts, but I was not able to express any colostrum. Mom reports no breast changes with pregnancy. Mom was told to call for lactation if she wants help to latch the baby when he next cues.  Mom does have a DEP, and I explained to her how pumping will protect her milk supply, if the baby will not latch.    Maternal Data Formula Feeding for Exclusion: Yes Reason for exclusion: Mother's choice to formula and breast feed on admission Has patient been taught Hand Expression?: Yes Does the patient have breastfeeding experience prior to this delivery?: Yes  Feeding    LATCH Score/Interventions                      Lactation Tools Discussed/Used     Consult Status Consult Status: Follow-up Date: 07/28/15 Follow-up type: In-patient    Leslie Jackson, Leslie Jackson 07/27/2015, 1:11 PM

## 2015-07-27 NOTE — Progress Notes (Signed)
Post Partum Day 1 Subjective: no complaints, up ad lib, voiding and tolerating PO, small lochia, plans to breastfeed, plans to bottle feed, oral progesterone-only contraceptive  Objective: Blood pressure 96/55, pulse 66, temperature 98.2 F (36.8 C), temperature source Oral, resp. rate 16, height 5\' 3"  (1.6 m), weight 77.111 kg (170 lb), last menstrual period 10/10/2014, unknown if currently breastfeeding.  Physical Exam:  General: alert, cooperative and no distress Lochia:normal flow Chest: CTAB Heart: RRR no m/r/g Abdomen: +BS, soft, nontender,  Uterine Fundus: firm DVT Evaluation: No evidence of DVT seen on physical exam. Extremities: no edema   Recent Labs  07/26/15 1145  HGB 12.6  HCT 37.6    Assessment/Plan: Plan for discharge tomorrow and Lactation consult   LOS: 1 day   CRESENZO-DISHMAN,Cliff Damiani 07/27/2015, 7:50 AM

## 2015-07-27 NOTE — Progress Notes (Signed)
UR chart review completed.  

## 2015-07-28 ENCOUNTER — Encounter (HOSPITAL_COMMUNITY): Payer: Self-pay | Admitting: Advanced Practice Midwife

## 2015-07-28 ENCOUNTER — Inpatient Hospital Stay (HOSPITAL_COMMUNITY): Admission: RE | Admit: 2015-07-28 | Payer: No Typology Code available for payment source | Source: Ambulatory Visit

## 2015-07-28 MED ORDER — NORGESTIMATE-ETH ESTRADIOL 0.25-35 MG-MCG PO TABS: 1.0000 | ORAL_TABLET | Freq: Every day | ORAL | Status: DC

## 2015-07-28 MED ORDER — IBUPROFEN 600 MG PO TABS: 600.0000 mg | ORAL_TABLET | Freq: Four times a day (QID) | ORAL | Status: DC | PRN

## 2015-07-28 NOTE — Discharge Instructions (Signed)

## 2015-07-28 NOTE — Discharge Summary (Signed)
OB Discharge Summary     Patient Name: Leslie Jackson DOB: 1991/04/11 MRN: [id number]  Date of admission: 07/26/2015 Delivering MD: Jacklyn ShellRESENZO-DISHMON, FRANCES   Date of discharge: 07/28/2015  Admitting diagnosis: 41WKS,INDUCTION for BPP 6/8 Intrauterine pregnancy: 6655w2d     Secondary diagnosis:  Active Problems:   Pregnancy  Additional problems: prev C/S- desires TOLAC     Discharge diagnosis: Term Pregnancy Delivered                                                                                                Post partum procedures:none  Augmentation: AROM and Pitocin  Complications: None  Hospital course:  Induction of Labor With Vaginal Delivery   25 y.o. yo [id number] at 7555w2d was admitted to the hospital 07/26/2015 for induction of labor.  Indication for induction: BPP 6/8.  Patient had an uncomplicated labor course as follows: Membrane Rupture Time/Date: 7:19 PM ,07/26/2015   Intrapartum Procedures: Episiotomy: None [1]                                         Lacerations:  2nd degree [3]  Patient had delivery of a Viable infant: VBAC Information for the patient's newborn:  Rosana BergerSanchez, Boy Merilynn [[id number]][[id number]]  Delivery Method: Vaginal, Spontaneous Delivery (Filed from Delivery Summary)   07/26/2015  Details of delivery can be found in separate delivery note.  Patient had a routine postpartum course. Patient is discharged home 07/28/2015.   Physical exam  Filed Vitals:   07/27/15 0405 07/27/15 0900 07/27/15 1722 07/28/15 0534  BP: 96/55 99/66 95/50  84/51  Pulse: 66 68 84 71  Temp: 98.2 F (36.8 C) 98 F (36.7 C) 98.1 F (36.7 C) 97.8 F (36.6 C)  TempSrc: Oral Oral Oral Oral  Resp: 16 16 18 18   Height:      Weight:       General: alert and cooperative Lochia: appropriate Uterine Fundus: firm Incision: N/A DVT Evaluation: No evidence of DVT seen on physical exam. Labs: Lab Results  Component Value Date   WBC 11.2* 07/26/2015   HGB 12.6 07/26/2015   HCT 37.6  07/26/2015   MCV 90.6 07/26/2015   PLT 166 07/26/2015   CMP Latest Ref Rng 03/09/2015  Glucose 65 - 99 mg/dL 82  BUN 6 - 20 mg/dL 12  Creatinine 1.300.44 - 8.651.00 mg/dL 7.840.53  Sodium 696135 - 295145 mmol/L 139  Potassium 3.5 - 5.1 mmol/L 3.7  Chloride 101 - 111 mmol/L 105  CO2 22 - 32 mmol/L 24  Calcium 8.9 - 10.3 mg/dL 9.2  Total Protein 6.5 - 8.1 g/dL 6.5  Total Bilirubin 0.3 - 1.2 mg/dL 0.8  Alkaline Phos 38 - 126 U/L 48  AST 15 - 41 U/L 19  ALT 14 - 54 U/L 12(L)    Discharge instruction: per After Visit Summary and "Baby and Me Booklet".  After visit meds:    Medication List    STOP taking these medications        promethazine 25  MG tablet  Commonly known as:  PHENERGAN      TAKE these medications        ibuprofen 600 MG tablet  Commonly known as:  ADVIL,MOTRIN  Take 1 tablet (600 mg total) by mouth every 6 (six) hours as needed.     norgestimate-ethinyl estradiol 0.25-35 MG-MCG tablet  Commonly known as:  SPRINTEC 28  Take 1 tablet by mouth daily. Starting on 08/26/15  Start taking on:  08/26/2015     prenatal multivitamin Tabs tablet  Take 1 tablet by mouth daily at 12 noon.        Diet: routine diet  Activity: Advance as tolerated. Pelvic rest for 6 weeks.   Outpatient follow up:6 weeks Follow up Appt:No future appointments. Follow up Visit:No Follow-up on file.  Postpartum contraception: Combination OCPs- to start 4/30  Newborn Data: Live born female  Birth Weight: 7 lb 14.5 oz (3586 g) APGAR: 9, 9  Baby Feeding: Bottle Disposition:home with mother   07/28/2015 Cam Hai, CNM  9:42 AM

## 2015-09-26 ENCOUNTER — Ambulatory Visit: Payer: Medicaid Other | Attending: Internal Medicine

## 2015-10-08 ENCOUNTER — Ambulatory Visit (INDEPENDENT_AMBULATORY_CARE_PROVIDER_SITE_OTHER): Payer: No Typology Code available for payment source | Admitting: Family Medicine

## 2015-10-08 ENCOUNTER — Encounter: Payer: Self-pay | Admitting: Family Medicine

## 2015-10-08 VITALS — BP 113/60 | HR 61 | Temp 97.8°F | Resp 14 | Ht 63.0 in | Wt 146.0 lb

## 2015-10-08 DIAGNOSIS — I499 Cardiac arrhythmia, unspecified: Secondary | ICD-10-CM

## 2015-10-08 LAB — CBC WITH DIFFERENTIAL/PLATELET
Basophils Absolute: 0 cells/uL (ref 0–200)
Basophils Relative: 0 %
EOS ABS: 58 {cells}/uL (ref 15–500)
Eosinophils Relative: 1 %
HEMATOCRIT: 39.5 % (ref 35.0–45.0)
Hemoglobin: 12.4 g/dL (ref 11.7–15.5)
LYMPHS PCT: 35 %
Lymphs Abs: 2030 cells/uL (ref 850–3900)
MCH: 27.5 pg (ref 27.0–33.0)
MCHC: 31.4 g/dL — AB (ref 32.0–36.0)
MCV: 87.6 fL (ref 80.0–100.0)
MONO ABS: 348 {cells}/uL (ref 200–950)
MONOS PCT: 6 %
MPV: 10.3 fL (ref 7.5–12.5)
NEUTROS PCT: 58 %
Neutro Abs: 3364 cells/uL (ref 1500–7800)
PLATELETS: 226 10*3/uL (ref 140–400)
RBC: 4.51 MIL/uL (ref 3.80–5.10)
RDW: 13 % (ref 11.0–15.0)
WBC: 5.8 10*3/uL (ref 3.8–10.8)

## 2015-10-08 NOTE — Patient Instructions (Signed)
Irregular heartbeat A palpitation is the feeling that your heartbeat is irregular or is faster than normal. It may feel like your heart is fluttering or skipping a beat. Palpitations are usually not a serious problem. However, in some cases, you may need further medical evaluation. CAUSES  Palpitations can be caused by:  Smoking.  Caffeine or other stimulants, such as diet pills or energy drinks.  Alcohol.  Stress and anxiety.  Strenuous physical activity.  Fatigue.  Certain medicines.  Heart disease, especially if you have a history of irregular heart rhythms (arrhythmias), such as atrial fibrillation, atrial flutter, or supraventricular tachycardia.  An improperly working pacemaker or defibrillator. DIAGNOSIS  To find the cause of your palpitations, your health care provider will take your medical history and perform a physical exam. Your health care provider may also have you take a test called an ambulatory electrocardiogram (ECG). An ECG records your heartbeat patterns over a 24-hour period. You may also have other tests, such as:  Transthoracic echocardiogram (TTE). During echocardiography, sound waves are used to evaluate how blood flows through your heart.  Transesophageal echocardiogram (TEE).  Cardiac monitoring. This allows your health care provider to monitor your heart rate and rhythm in real time.  Holter monitor. This is a portable device that records your heartbeat and can help diagnose heart arrhythmias. It allows your health care provider to track your heart activity for several days, if needed.  Stress tests by exercise or by giving medicine that makes the heart beat faster. TREATMENT  Treatment of palpitations depends on the cause of your symptoms and can vary greatly. Most cases of palpitations do not require any treatment other than time, relaxation, and monitoring your symptoms. Other causes, such as atrial fibrillation, atrial flutter, or supraventricular  tachycardia, usually require further treatment. HOME CARE INSTRUCTIONS   Avoid:  Caffeinated coffee, tea, soft drinks, diet pills, and energy drinks.  Chocolate.  Alcohol.  Stop smoking if you smoke.  Reduce your stress and anxiety. Things that can help you relax include:  A method of controlling things in your body, such as your heartbeats, with your mind (biofeedback).  Yoga.  Meditation.  Physical activity such as swimming, jogging, or walking.  Get plenty of rest and sleep. SEEK MEDICAL CARE IF:   You continue to have a fast or irregular heartbeat beyond 24 hours.  Your palpitations occur more often. SEEK IMMEDIATE MEDICAL CARE IF:  You have chest pain or shortness of breath.  You have a severe headache.  You feel dizzy or you faint. MAKE SURE YOU:  Understand these instructions.  Will watch your condition.  Will get help right away if you are not doing well or get worse.   This information is not intended to replace advice given to you by your health care provider. Make sure you discuss any questions you have with your health care provider.   Document Released: 04/11/2000 Document Revised: 04/19/2013 Document Reviewed: 06/13/2011 Elsevier Interactive Patient Education [employer]2016 [employer]

## 2015-10-08 NOTE — Progress Notes (Signed)
Subjective:    Patient ID: Leslie Jackson, female    DOB: 01/06/1991, 25 y.o.   MRN: [id number]  HPI [name], a 25 year old female presents accompanied by family to establish care. She states that she has been receiving primary care at the [facility]. She was informed several months ago that she has an arrhythmia. She denies  abdominal pain, calf pain, chest pain, cough, dizziness, leg swelling, palpitations, rapid heart beat, shortness of breath, slow heart beat and syncope.  She does not have any known cardiac risk factors at this time.   Immunization History  Administered Date(s) Administered  . Tdap 05/10/2015    Social History   Social History  . Marital Status: Married    Spouse Name: N/A  . Number of Children: N/A  . Years of Education: N/A   Occupational History  . Not on file.   Social History Main Topics  . Smoking status: Never Smoker   . Smokeless tobacco: Never Used  . Alcohol Use: No  . Drug Use: No  . Sexual Activity: Not Currently    Birth Control/ Protection: None     Comment: planning for IUD   Other Topics Concern  . Not on file   Social History Narrative   Review of Systems  Constitutional: Negative.  Negative for fatigue.  HENT: Negative.   Eyes: Negative.  Negative for photophobia, redness and visual disturbance.  Respiratory: Negative.   Cardiovascular: Negative.  Negative for chest pain, palpitations and leg swelling.  Gastrointestinal: Negative.  Negative for constipation and blood in stool.  Endocrine: Negative.  Negative for polydipsia, polyphagia and polyuria.  Genitourinary: Negative.   Musculoskeletal: Negative.   Skin: Negative.   Neurological: Negative.   Hematological: Negative.   Psychiatric/Behavioral: Negative.       Objective:   Physical Exam  Constitutional: She is oriented to person, place, and time. She appears well-developed and well-nourished.  HENT:  Head: Normocephalic and  atraumatic.  Right Ear: External ear normal.  Left Ear: External ear normal.  Mouth/Throat: Oropharynx is clear and moist.  Eyes: Conjunctivae and EOM are normal. Pupils are equal, round, and reactive to light.  Neck: Normal range of motion. Neck supple.  Cardiovascular: Normal pulses.  An irregular rhythm present.  Pulses:      Carotid pulses are 2+ on the right side, and 2+ on the left side.      Radial pulses are 2+ on the right side, and 2+ on the left side.       Femoral pulses are 2+ on the right side, and 2+ on the left side.      Popliteal pulses are 2+ on the right side, and 2+ on the left side.       Dorsalis pedis pulses are 2+ on the right side, and 2+ on the left side.       Posterior tibial pulses are 2+ on the right side, and 2+ on the left side.  Pulmonary/Chest: Effort normal and breath sounds normal.  Abdominal: Soft. Bowel sounds are normal. There is tenderness. There is guarding.  Musculoskeletal: Normal range of motion.  Neurological: She is alert and oriented to person, place, and time. She has normal reflexes.  Skin: Skin is warm and dry.  Psychiatric: She has a normal mood and affect. Her behavior is normal. Judgment and thought content normal.      BP 113/60 mmHg  Pulse 61  Temp(Src) 97.8 F (36.6 C) (Oral)  Resp 14  Ht 5\' 3"  (1.6 m)  Wt 146 lb (66.225 kg)  BMI 25.87 kg/m2  SpO2 100%  LMP 09/09/2015 Assessment & Plan:  1. Irregular heartbeat Will follow up by phone with laboratory results. Premature ventricular complexes, borderline EKG. Will follow up in 6 months for repeat EKG.  Patient advised to avoid stress, caffeine, cigarette smoke, or illicit drugs - CBC with Differential - EKG 12-Lead - COMPLETE METABOLIC PANEL WITH GFR - TSH   Routine Health Maintenance:  Last pap smear April 2017 Recommend Monthly self breast exams Recommend barrier protection with sexual intercourse   RTC: 6 months  Leslie Jackson M, FNP

## 2015-10-09 ENCOUNTER — Telehealth: Payer: Self-pay

## 2015-10-09 LAB — COMPLETE METABOLIC PANEL WITH GFR
ALT: 8 U/L (ref 6–29)
AST: 14 U/L (ref 10–30)
Albumin: 4 g/dL (ref 3.6–5.1)
Alkaline Phosphatase: 69 U/L (ref 33–115)
BUN: 10 mg/dL (ref 7–25)
CALCIUM: 8.9 mg/dL (ref 8.6–10.2)
CHLORIDE: 105 mmol/L (ref 98–110)
CO2: 24 mmol/L (ref 20–31)
CREATININE: 0.69 mg/dL (ref 0.50–1.10)
Glucose, Bld: 80 mg/dL (ref 65–99)
POTASSIUM: 3.9 mmol/L (ref 3.5–5.3)
Sodium: 139 mmol/L (ref 135–146)
Total Bilirubin: 0.5 mg/dL (ref 0.2–1.2)
Total Protein: 6.7 g/dL (ref 6.1–8.1)

## 2015-10-09 NOTE — Telephone Encounter (Signed)
Called, no answer. Left message advising patient of normal labs and to keep follow up appointment. Also reminded to limit stress and caffeine as directed. Asked if any questions to return call and left call back number. Thanks!

## 2015-10-09 NOTE — Telephone Encounter (Signed)
-----   Message from Massie MaroonLachina M Hollis, [place] sent at 10/09/2015  8:27 AM EDT ----- Regarding: lab results Please inform patient that all laboratory values are within a normal range. Please return to office as scheduled to repeat EKG. Continue to limit stress and caffeine intake (coffee, tea, soda, chocolate).   Thanks  ----- Message -----    From: Lab in [facility] Interface    Sent: 10/09/2015  12:54 AM      To: Massie MaroonLachina M Hollis, FNP

## 2015-11-02 ENCOUNTER — Ambulatory Visit: Payer: Medicaid Other | Attending: Internal Medicine

## 2016-03-27 ENCOUNTER — Ambulatory Visit: Payer: Self-pay | Attending: Internal Medicine

## 2016-04-08 ENCOUNTER — Ambulatory Visit (INDEPENDENT_AMBULATORY_CARE_PROVIDER_SITE_OTHER): Payer: No Typology Code available for payment source | Admitting: Family Medicine

## 2016-04-08 ENCOUNTER — Encounter: Payer: Self-pay | Admitting: Family Medicine

## 2016-04-08 VITALS — BP 99/48 | HR 61 | Temp 98.2°F | Resp 14 | Ht 64.0 in | Wt 143.0 lb

## 2016-04-08 DIAGNOSIS — R9431 Abnormal electrocardiogram [ECG] [EKG]: Secondary | ICD-10-CM

## 2016-04-08 DIAGNOSIS — Z8679 Personal history of other diseases of the circulatory system: Secondary | ICD-10-CM

## 2016-04-08 DIAGNOSIS — R0600 Dyspnea, unspecified: Secondary | ICD-10-CM

## 2016-04-08 LAB — CBC WITH DIFFERENTIAL/PLATELET
BASOS ABS: 0 {cells}/uL (ref 0–200)
BASOS PCT: 0 %
EOS ABS: 69 {cells}/uL (ref 15–500)
Eosinophils Relative: 1 %
HEMATOCRIT: 41.3 % (ref 35.0–45.0)
Hemoglobin: 13.7 g/dL (ref 11.7–15.5)
LYMPHS PCT: 37 %
Lymphs Abs: 2553 cells/uL (ref 850–3900)
MCH: 30.1 pg (ref 27.0–33.0)
MCHC: 33.2 g/dL (ref 32.0–36.0)
MCV: 90.8 fL (ref 80.0–100.0)
MONO ABS: 621 {cells}/uL (ref 200–950)
MPV: 11.1 fL (ref 7.5–12.5)
Monocytes Relative: 9 %
Neutro Abs: 3657 cells/uL (ref 1500–7800)
Neutrophils Relative %: 53 %
Platelets: 187 10*3/uL (ref 140–400)
RBC: 4.55 MIL/uL (ref 3.80–5.10)
RDW: 13 % (ref 11.0–15.0)
WBC: 6.9 10*3/uL (ref 3.8–10.8)

## 2016-04-08 NOTE — Progress Notes (Signed)
Subjective:    Patient ID: Leslie Jackson, female    DOB: 07/28/1990, 25 y.o.   MRN: [id number]  HPI [name], a 25 year old female presents accompanied by family for a follow up of an irregular heart beat and dyspnea. Patient was evaluated for an irregular heartbeat 6 months ago. She was informed during pregnancy of an arrhythmia.  She reports periodic shortness of breath on exertion. She denies  abdominal pain, calf pain, chest pain, cough, dizziness, leg swelling, palpitations, rapid heart beat, slow heart beat and syncope.  She does not have any known cardiac risk factors at this time.   Immunization History  Administered Date(s) Administered  . Tdap 05/10/2015    Social History   Social History  . Marital status: Married    Spouse name: N/A  . Number of children: N/A  . Years of education: N/A   Occupational History  . Not on file.   Social History Main Topics  . Smoking status: Never Smoker  . Smokeless tobacco: Never Used  . Alcohol use No  . Drug use: No  . Sexual activity: Not Currently    Birth control/ protection: None     Comment: planning for IUD   Other Topics Concern  . Not on file   Social History Narrative  . No narrative on file   Review of Systems  Constitutional: Negative.  Negative for fatigue.  HENT: Negative.   Eyes: Negative.  Negative for photophobia, redness and visual disturbance.  Respiratory: Negative.   Cardiovascular: Negative.  Negative for chest pain, palpitations and leg swelling.  Gastrointestinal: Negative.  Negative for blood in stool and constipation.  Endocrine: Negative.  Negative for polydipsia, polyphagia and polyuria.  Genitourinary: Negative.   Musculoskeletal: Negative.   Skin: Negative.   Neurological: Negative.   Hematological: Negative.   Psychiatric/Behavioral: Negative.       Objective:   Physical Exam  Constitutional: She is oriented to person, place, and time. She appears well-developed and  well-nourished.  HENT:  Head: Normocephalic and atraumatic.  Right Ear: External ear normal.  Left Ear: External ear normal.  Mouth/Throat: Oropharynx is clear and moist.  Eyes: Conjunctivae and EOM are normal. Pupils are equal, round, and reactive to light.  Neck: Normal range of motion. Neck supple.  Cardiovascular: Normal pulses.  An irregular rhythm present.  Pulses:      Carotid pulses are 2+ on the right side, and 2+ on the left side.      Radial pulses are 2+ on the right side, and 2+ on the left side.       Femoral pulses are 2+ on the right side, and 2+ on the left side.      Popliteal pulses are 2+ on the right side, and 2+ on the left side.       Dorsalis pedis pulses are 2+ on the right side, and 2+ on the left side.       Posterior tibial pulses are 2+ on the right side, and 2+ on the left side.  Pulmonary/Chest: Effort normal and breath sounds normal.  Abdominal: Soft. Bowel sounds are normal. There is tenderness. There is guarding.  Musculoskeletal: Normal range of motion.  Neurological: She is alert and oriented to person, place, and time. She has normal reflexes.  Skin: Skin is warm and dry.  Psychiatric: She has a normal mood and affect. Her behavior is normal. Judgment and thought content normal.      BP Marland Kitchen(!)  99/48 (BP Location: Left Arm, Patient Position: Sitting, Cuff Size: Normal)   Pulse 61   Temp 98.2 F (36.8 C) (Oral)   Resp 14   Ht 5\' 4"  (1.626 m)   Wt 143 lb (64.9 kg)   SpO2 100%   BMI 24.55 kg/m  Assessment & Plan:  1. History of irregular heartbeat Reviewed EKG, abnormal EKG; sinus rhythm with occasional premature ventricular complexes. Will send referral to cardiology for further evaluation. Patient advised to avoid stress, caffeine, cigarette smoke, or illicit drugs - EKG 12-Lead - COMPLETE METABOLIC PANEL WITH GFR - CBC with Differential - DG Chest 2 View; Future - ECHOCARDIOGRAM COMPLETE; Future  2. Dyspnea, unspecified type Will follow up  by phone after reviewing chest xray  - DG Chest 2 View; Future   3. Nonspecific abnormal electrocardiogram (ECG) (EKG)  - ECHOCARDIOGRAM COMPLETE; Future   Routine Health Maintenance:  Last pap smear April 2017 Recommend Monthly self breast exams Recommend barrier protection with sexual intercourse   RTC: Will follow up by phone after reviewing test results, will schedule follow up at that time.  Massie MaroonHollis,Skyah Hannon M, FNP

## 2016-04-08 NOTE — Patient Instructions (Addendum)
Shortness of Breath  Shortness of breath means you have trouble breathing. Shortness of breath needs medical care right away.  HOME CARE   ? Do not smoke.  ? Avoid being around chemicals or things (paint fumes, dust) that may bother your breathing.  ? Rest as needed. Slowly begin your normal activities.  ? Only take medicines as told by your doctor.  ? Keep all doctor visits as told.  GET HELP RIGHT AWAY IF:   ? Your shortness of breath gets worse.  ? You feel lightheaded, pass out (faint), or have a cough that is not helped by medicine.  ? You cough up blood.  ? You have pain with breathing.  ? You have pain in your chest, arms, shoulders, or belly (abdomen).  ? You have a fever.  ? You cannot walk up stairs or exercise the way you normally do.  ? You do not get better in the time expected.  ? You have a hard time doing normal activities even with rest.  ? You have problems with your medicines.  ? You have any new symptoms.  MAKE SURE YOU:  ? Understand these instructions.  ? Will watch your condition.  ? Will get help right away if you are not doing well or get worse.  This information is not intended to replace advice given to you by your health care provider. Make sure you discuss any questions you have with your health care provider.  Document Released: 10/01/2007 Document Revised: 04/19/2013 Document Reviewed: 06/30/2011  Elsevier Interactive Patient Education ? 2017 [employer] Inc[employer]

## 2016-04-09 ENCOUNTER — Telehealth: Payer: Self-pay

## 2016-04-09 ENCOUNTER — Ambulatory Visit (HOSPITAL_COMMUNITY)
Admission: RE | Admit: 2016-04-09 | Discharge: 2016-04-09 | Disposition: A | Discharge status: Home / Self Care | Payer: No Typology Code available for payment source | Source: Ambulatory Visit | Attending: Family Medicine | Admitting: Family Medicine

## 2016-04-09 DIAGNOSIS — R0600 Dyspnea, unspecified: Secondary | ICD-10-CM

## 2016-04-09 DIAGNOSIS — Z8679 Personal history of other diseases of the circulatory system: Secondary | ICD-10-CM | POA: Insufficient documentation

## 2016-04-09 LAB — COMPLETE METABOLIC PANEL WITH GFR
ALT: 9 U/L (ref 6–29)
AST: 17 U/L (ref 10–30)
Albumin: 4.6 g/dL (ref 3.6–5.1)
Alkaline Phosphatase: 62 U/L (ref 33–115)
BUN: 16 mg/dL (ref 7–25)
CHLORIDE: 105 mmol/L (ref 98–110)
CO2: 27 mmol/L (ref 20–31)
CREATININE: 0.69 mg/dL (ref 0.50–1.10)
Calcium: 9.3 mg/dL (ref 8.6–10.2)
GFR, Est Non African American: >89 mL/min (ref >=60)
GLUCOSE: 80 mg/dL (ref 65–99)
Potassium: 4 mmol/L (ref 3.5–5.3)
SODIUM: 140 mmol/L (ref 135–146)
TOTAL PROTEIN: 7 g/dL (ref 6.1–8.1)
Total Bilirubin: 0.4 mg/dL (ref 0.2–1.2)

## 2016-04-09 NOTE — Telephone Encounter (Signed)
Called and advised of normal xray and to keep 6 month follow up. Patient verbalized understanding. Thanks!

## 2016-04-09 NOTE — Telephone Encounter (Signed)
-----   Message from Massie MaroonLachina M Hollis, [place] sent at 04/09/2016 10:14 AM EST ----- Regarding: xray results Please inform patient that chest xray was negative, please follow up in 6 months as scheduled.   Thanks ----- Message ----- From: Interface, Rad Results In Sent: 04/09/2016   8:30 AM To: Massie MaroonLachina M Hollis, FNP

## 2016-06-20 ENCOUNTER — Ambulatory Visit: Payer: Self-pay | Attending: Internal Medicine

## 2016-09-11 ENCOUNTER — Ambulatory Visit (INDEPENDENT_AMBULATORY_CARE_PROVIDER_SITE_OTHER): Payer: No Typology Code available for payment source | Admitting: Family Medicine

## 2016-09-11 ENCOUNTER — Encounter: Payer: Self-pay | Admitting: Family Medicine

## 2016-09-11 VITALS — BP 103/61 | HR 68 | Temp 98.5°F | Resp 16 | Ht 64.0 in | Wt 132.0 lb

## 2016-09-11 DIAGNOSIS — R0789 Other chest pain: Secondary | ICD-10-CM

## 2016-09-11 DIAGNOSIS — I493 Ventricular premature depolarization: Secondary | ICD-10-CM

## 2016-09-11 DIAGNOSIS — Z8679 Personal history of other diseases of the circulatory system: Secondary | ICD-10-CM

## 2016-09-11 DIAGNOSIS — R002 Palpitations: Secondary | ICD-10-CM

## 2016-09-11 LAB — CBC WITH DIFFERENTIAL/PLATELET
BASOS ABS: 0 {cells}/uL (ref 0–200)
Basophils Relative: 0 %
EOS ABS: 87 {cells}/uL (ref 15–500)
EOS PCT: 1 %
HCT: 42.1 % (ref 35.0–45.0)
Hemoglobin: 13.6 g/dL (ref 11.7–15.5)
LYMPHS PCT: 28 %
Lymphs Abs: 2436 cells/uL (ref 850–3900)
MCH: 30 pg (ref 27.0–33.0)
MCHC: 32.3 g/dL (ref 32.0–36.0)
MCV: 92.9 fL (ref 80.0–100.0)
MONOS PCT: 6 %
MPV: 11 fL (ref 7.5–12.5)
Monocytes Absolute: 522 cells/uL (ref 200–950)
NEUTROS ABS: 5655 {cells}/uL (ref 1500–7800)
NEUTROS PCT: 65 %
PLATELETS: 191 10*3/uL (ref 140–400)
RBC: 4.53 MIL/uL (ref 3.80–5.10)
RDW: 12.7 % (ref 11.0–15.0)
WBC: 8.7 10*3/uL (ref 3.8–10.8)

## 2016-09-11 LAB — BASIC METABOLIC PANEL WITH GFR
BUN: 12 mg/dL (ref 7–25)
CALCIUM: 9.4 mg/dL (ref 8.6–10.2)
CO2: 27 mmol/L (ref 20–31)
Chloride: 102 mmol/L (ref 98–110)
Creat: 0.78 mg/dL (ref 0.50–1.10)
GLUCOSE: 107 mg/dL — AB (ref 65–99)
POTASSIUM: 3.6 mmol/L (ref 3.5–5.3)
SODIUM: 139 mmol/L (ref 135–146)

## 2016-09-11 NOTE — Patient Instructions (Addendum)
Nonspecific Chest Pain Chest pain can be caused by many different conditions. There is a chance that your pain could be related to something serious, such as a heart attack or a blood clot in your lungs. Chest pain can also be caused by conditions that are not life-threatening. If you have chest pain, it is very important to follow up with your doctor. Follow these instructions at home: Medicines   If you were prescribed an antibiotic medicine, take it as told by your doctor. Do not stop taking the antibiotic even if you start to feel better.  Take over-the-counter and prescription medicines only as told by your doctor. Lifestyle   Do not use any products that contain nicotine or tobacco, such as cigarettes and e-cigarettes. If you need help quitting, ask your doctor.  Do not drink alcohol.  Make lifestyle changes as told by your doctor. These may include:  Getting regular exercise. Ask your doctor for some activities that are safe for you.  Eating a heart-healthy diet. A diet specialist (dietitian) can help you to learn healthy eating options.  Staying at a healthy weight.  Managing diabetes, if needed.  Lowering your stress, as with deep breathing or spending time in nature. General instructions   Avoid any activities that make you feel chest pain.  If your chest pain is because of heartburn:  Raise (elevate) the head of your bed about 6 inches (15 cm). You can do this by putting blocks under the bed legs at the head of the bed.  Do not sleep with extra pillows under your head. That does not help heartburn.  Keep all follow-up visits as told by your doctor. This is important. This includes any further testing if your chest pain does not go away. Contact a doctor if:  Your chest pain does not go away.  You have a rash with blisters on your chest.  You have a fever.  You have chills. Get help right away if:  Your chest pain is worse.  You have a cough that gets worse,  or you cough up blood.  You have very bad (severe) pain in your belly (abdomen).  You are very weak.  You pass out (faint).  You have either of these for no clear reason:  Sudden chest discomfort.  Sudden discomfort in your arms, back, neck, or jaw.  You have shortness of breath at any time.  You suddenly start to sweat, or your skin gets clammy.  You feel sick to your stomach (nauseous).  You throw up (vomit).  You suddenly feel light-headed or dizzy.  Your heart starts to beat fast, or it feels like it is skipping beats. These symptoms may be an emergency. Do not wait to see if the symptoms will go away. Get medical help right away. Call your local emergency services (911 in the [place] Do not drive yourself to the hospital. This information is not intended to replace advice given to you by your health care provider. Make sure you discuss any questions you have with your health care provider. Document Released: 10/01/2007 Document Revised: 01/07/2016 Document Reviewed: 01/07/2016 Elsevier Interactive Patient Education  2017 Elsevier Inc.  Premature Ventricular Contraction A premature ventricular contraction (PVC) is a common irregularity in the normal heart rhythm. These contractions are extra heartbeats that start in the heart ventricles and occur too early in the normal sequence. During the PVC, the heart's normal electrical pathway is not used, so the beat is shorter and less effective. In  most cases, these contractions come and go and do not require treatment. What are the causes? In many cases, the cause may not be known. Common causes of the condition include:  Smoking.  Drinking alcohol.  Caffeine.  Certain medicines.  Some illegal drugs.  Stress. Certain medical conditions can also cause PVCs:  Changes in minerals in the blood (electrolytes).  Heart failure.  Heart valve problems.  Low blood oxygen levels or high carbon dioxide levels.  Heart attack,  or coronary artery disease. What are the signs or symptoms? The main symptom of this condition is a fast or skipped heartbeat (palpitations). Other symptoms include:  Chest pain.  Shortness of breath.  Feeling tired.  Dizziness. In some cases, there are no symptoms. How is this diagnosed? This condition may be diagnosed based on:  Your medical history.  A physical exam. During the exam, the health care provider will check for irregular heartbeats.  Tests, such as:  An ECG (electrocardiogram) to monitor the electrical activity of your heart.  Holter monitor testing. This involves wearing a device that clips to your clothing and monitors the electrical activity of your heart over longer periods of time.  Stress tests to see how exercise affects your heart rhythm and blood supply.  Echocardiogram. This test uses sound waves (ultrasound) to produce an image of your heart.  Electrophysiology study. This test checks the electric pathways in your heart. How is this treated? Treatment depends on any underlying conditions, the type of PVCs that you are having, and how much the symptoms are interfering with your daily life. Possible treatments include:  Avoiding things that can trigger the premature contractions, such as caffeine or alcohol.  Medicines. These may be given if symptoms are severe or if the extra heartbeats are frequent.  Treatment for any underlying condition that is found to be the cause of the contractions.  Catheter ablation. This procedure destroys the heart tissues that send abnormal signals. In some cases, no treatment is required. Follow these instructions at home: Lifestyle  Follow these instructions as told by your health care provider:  Do not use any products that contain nicotine or tobacco, such as cigarettes and e-cigarettes. If you need help quitting, ask your health care provider.  If caffeine triggers episodes of PVC, do not eat, drink, or use  anything with caffeine in it.  If caffeine does not seem to trigger episodes, consume caffeine in moderation.  If alcohol triggers episodes of PVC, do not drink alcohol.  If alcohol does not seem to trigger episodes, limit alcohol intake to no more than 1 drink a day for nonpregnant women and 2 drinks a day for men. One drink equals 12 oz of beer, 5 oz of wine, or 1 oz of hard liquor.  Exercise regularly. Ask your health care provider what type of exercise is safe for you.  Find healthy ways to manage stress. Avoid stressful situations when possible.  Try to get at least 7-9 hours of sleep each night, or as much as recommended by your health care provider.  Do not use illegal drugs. General instructions   Take over-the-counter and prescription medicines only as told by your health care provider.  Keep all follow-up visits as told by your health care provider. This is important. Get help right away if:  You feel palpitations that are frequent or continual.  You have chest pain.  You have shortness of breath.  You have sweating for no reason.  You have nausea and  vomiting.  You become light-headed or you faint. This information is not intended to replace advice given to you by your health care provider. Make sure you discuss any questions you have with your health care provider. Document Released: 11/30/2003 Document Revised: 12/07/2015 Document Reviewed: 09/19/2015 Elsevier Interactive Patient Education  2017 [employer].

## 2016-09-11 NOTE — Progress Notes (Signed)
Subjective:    Patient ID: Leslie Jackson, female    DOB: 07-28-1990, 26 y.o.   MRN: [id number]  Leslie Jackson, a 26 year old female presents accompanied by family for a follow up of an irregular heart beat and dyspnea. Patient was evaluated for an irregular heartbeat 6 months ago. She was informed during pregnancy of an arrhythmia.  She reports periodic shortness of breath on exertion. She denies  abdominal pain, calf pain, chest pain, cough, dizziness, leg swelling, palpitations, rapid heart beat, slow heart beat and syncope.  She does not have any known cardiac risk factors at this time.     Palpitations   This is a chronic problem. Associated symptoms include chest pain and dizziness. Pertinent negatives include no anxiety, chest fullness, coughing, diaphoresis, fever, irregular heartbeat, malaise/fatigue, nausea, near-syncope, numbness, shortness of breath, syncope, vomiting or weakness. Risk factors include family history (Family history of heart disease). Her past medical history is significant for anxiety. There is no history of anemia, drug use, heart disease, hyperthyroidism or a valve disorder.  Chest Pain   This is a recurrent problem. The current episode started more than 1 year ago. The problem occurs intermittently. The pain is at a severity of 2/10. The pain is mild. The quality of the pain is described as pressure. The pain radiates to the precordial region. Associated symptoms include dizziness and palpitations. Pertinent negatives include no abdominal pain, back pain, claudication, cough, diaphoresis, fever, headaches, irregular heartbeat, malaise/fatigue, nausea, near-syncope, numbness, orthopnea, shortness of breath, syncope, vomiting or weakness. She has tried nothing for the symptoms.  Her past medical history is significant for anxiety/panic attacks and arrhythmia.  Pertinent negatives for past medical history include no drug use, no heart disease, no hypertension,  no recent injury, no rheumatic fever, no sleep apnea, no stimulant use and no valve disorder.    Immunization History  Administered Date(s) Administered  . Tdap 05/10/2015    Social History   Social History  . Marital status: Married    Spouse name: N/A  . Number of children: N/A  . Years of education: N/A   Occupational History  . Not on file.   Social History Main Topics  . Smoking status: Never Smoker  . Smokeless tobacco: Never Used  . Alcohol use No  . Drug use: No  . Sexual activity: Not Currently    Birth control/ protection: None, IUD     Comment: planning for IUD   Other Topics Concern  . Not on file   Social History Narrative  . No narrative on file   Review of Systems  Constitutional: Negative.  Negative for diaphoresis, fatigue, fever and malaise/fatigue.  HENT: Negative.   Eyes: Negative.  Negative for photophobia, redness and visual disturbance.  Respiratory: Negative.  Negative for cough and shortness of breath.   Cardiovascular: Positive for chest pain and palpitations. Negative for orthopnea, claudication, leg swelling, syncope and near-syncope.  Gastrointestinal: Negative.  Negative for abdominal pain, blood in stool, constipation, nausea and vomiting.  Endocrine: Negative.  Negative for polydipsia, polyphagia and polyuria.  Genitourinary: Negative.   Musculoskeletal: Negative.  Negative for back pain.  Skin: Negative.   Neurological: Positive for dizziness and syncope. Negative for weakness, numbness and headaches.  Hematological: Negative.   Psychiatric/Behavioral: Negative.  The patient is not nervous/anxious.       Objective:   Physical Exam  Constitutional: She is oriented to person, place, and time. She appears well-developed and well-nourished.  HENT:  Head: Normocephalic and atraumatic.  Right Ear: External ear normal.  Left Ear: External ear normal.  Mouth/Throat: Oropharynx is clear and moist.  Eyes: Conjunctivae and EOM are normal.  Pupils are equal, round, and reactive to light.  Neck: Normal range of motion. Neck supple.  Cardiovascular: Normal rate and normal pulses.  An irregular rhythm present.  Murmur heard. Pulses:      Carotid pulses are 2+ on the right side, and 2+ on the left side.      Radial pulses are 2+ on the right side, and 2+ on the left side.       Femoral pulses are 2+ on the right side, and 2+ on the left side.      Popliteal pulses are 2+ on the right side, and 2+ on the left side.       Dorsalis pedis pulses are 2+ on the right side, and 2+ on the left side.       Posterior tibial pulses are 2+ on the right side, and 2+ on the left side.  Pulmonary/Chest: Effort normal and breath sounds normal.  Abdominal: Soft. Bowel sounds are normal. There is tenderness. There is guarding.  Musculoskeletal: Normal range of motion.  Neurological: She is alert and oriented to person, place, and time. She has normal reflexes. She displays tremor (left hand). Coordination and gait normal.  Skin: Skin is warm and dry.  Psychiatric: She has a normal mood and affect. Her behavior is normal. Judgment and thought content normal.      BP 103/61 (BP Location: Left Arm, Patient Position: Sitting, Cuff Size: Normal)   Pulse 68   Temp 98.5 F (36.9 C) (Oral)   Resp 16   Ht 5\' 4"  (1.626 m)   Wt 132 lb (59.9 kg)   LMP 08/09/2016   SpO2 100%   BMI 22.66 kg/m  Assessment & Plan:  1. History of irregular heartbeat Patient warrants a cardiology referral due to PVCs. She needs further work-up including a holter monitor Reviewed EKG, abnormal EKG; sinus rhythm with occasional premature ventricular complexes. Will send referral to cardiology for further evaluation. Patient advised to avoid stress, caffeine, cigarette smoke, or illicit drugs  2. Premature ventricular complex - ECHOCARDIOGRAM COMPLETE; Future - CBC with Differential  3. Heart palpitations - Thyroid Panel With TSH - BASIC METABOLIC PANEL WITH GFR -  ECHOCARDIOGRAM COMPLETE; Future  4. Other chest pain - ECHOCARDIOGRAM COMPLETE; Future   Routine Health Maintenance:  Last pap smear April 2017 Recommend Monthly self breast exams Recommend barrier protection with sexual intercourse   RTC: Will follow up by phone after reviewing test results, will schedule follow up at that time.     Nolon Nations  MSN, FNP-C [facility] Patient [facility] [address] [place], [place] [address] [phone]

## 2016-09-12 LAB — THYROID PANEL WITH TSH
FREE THYROXINE INDEX: 2.4 (ref 1.4–3.8)
T3 UPTAKE: 31 % (ref 22–35)
T4, Total: 7.8 ug/dL (ref 4.5–12.0)
TSH: 0.69 mIU/L

## 2016-09-15 LAB — POCT URINALYSIS DIP (DEVICE)
BILIRUBIN URINE: NEGATIVE
GLUCOSE, UA: NEGATIVE mg/dL
Ketones, ur: NEGATIVE mg/dL
LEUKOCYTES UA: NEGATIVE
Nitrite: NEGATIVE
Protein, ur: NEGATIVE mg/dL
SPECIFIC GRAVITY, URINE: 1.02 (ref 1.005–1.030)
UROBILINOGEN UA: 0.2 mg/dL (ref 0.0–1.0)
pH: 6.5 (ref 5.0–8.0)

## 2016-09-17 ENCOUNTER — Ambulatory Visit (HOSPITAL_COMMUNITY)
Admission: RE | Admit: 2016-09-17 | Discharge: 2016-09-17 | Disposition: A | Discharge status: Home / Self Care | Payer: No Typology Code available for payment source | Source: Ambulatory Visit | Attending: Family Medicine | Admitting: Family Medicine

## 2016-09-17 DIAGNOSIS — R002 Palpitations: Secondary | ICD-10-CM | POA: Insufficient documentation

## 2016-09-17 DIAGNOSIS — R0789 Other chest pain: Secondary | ICD-10-CM | POA: Insufficient documentation

## 2016-09-17 DIAGNOSIS — I493 Ventricular premature depolarization: Secondary | ICD-10-CM | POA: Insufficient documentation

## 2016-09-17 NOTE — Progress Notes (Signed)
  Echocardiogram 2D Echocardiogram has been performed.  Arvil ChacoFoster, Karem Tomaso 09/17/2016, 10:59 AM

## 2016-09-18 ENCOUNTER — Telehealth: Payer: Self-pay

## 2016-09-18 NOTE — Telephone Encounter (Signed)
Called and left message advising that all labs were in normal range. Thanks!

## 2016-09-19 ENCOUNTER — Telehealth: Payer: Self-pay

## 2016-09-19 NOTE — Telephone Encounter (Signed)
Called and left message advising that echo was ok and all structures of the heart where normal. Asked that she keep next appointment and call us if she needed to be seen sooner. Thanks!

## 2016-09-19 NOTE — Telephone Encounter (Signed)
-----   Message from Massie MaroonLachina M Hollis, [place] sent at 09/19/2016 12:02 PM EDT ----- Regarding: lab results  Reviewed echocardiogram. Results unremarkable. All structures of heart normal. Please follow up in office as scheduled.   Thanks ----- Message ----- From: Interface, Rad Results In Sent: 09/17/2016   2:09 PM To: Massie MaroonLachina M Hollis, FNP

## 2016-10-07 ENCOUNTER — Ambulatory Visit: Payer: No Typology Code available for payment source | Admitting: Family Medicine

## 2016-10-24 ENCOUNTER — Ambulatory Visit: Payer: Self-pay | Attending: Internal Medicine

## 2017-03-16 ENCOUNTER — Encounter: Payer: Self-pay | Admitting: Family Medicine

## 2017-03-16 ENCOUNTER — Ambulatory Visit: Payer: No Typology Code available for payment source | Admitting: Family Medicine

## 2017-03-16 ENCOUNTER — Ambulatory Visit (INDEPENDENT_AMBULATORY_CARE_PROVIDER_SITE_OTHER): Payer: Self-pay | Admitting: Family Medicine

## 2017-03-16 VITALS — BP 100/66 | HR 69 | Temp 98.5°F | Resp 16 | Ht 64.0 in | Wt 130.0 lb

## 2017-03-16 DIAGNOSIS — L659 Nonscarring hair loss, unspecified: Secondary | ICD-10-CM

## 2017-03-16 DIAGNOSIS — Z8679 Personal history of other diseases of the circulatory system: Secondary | ICD-10-CM

## 2017-03-16 DIAGNOSIS — I493 Ventricular premature depolarization: Secondary | ICD-10-CM

## 2017-03-16 LAB — TSH: TSH: 1.13 mIU/L

## 2017-03-16 NOTE — Patient Instructions (Addendum)
Will follow up by phone with any abnormal laboratory results.   Nonspecific Chest Pain Chest pain can be caused by many different conditions. There is a chance that your pain could be related to something serious, such as a heart attack or a blood clot in your lungs. Chest pain can also be caused by conditions that are not life-threatening. If you have chest pain, it is very important to follow up with your doctor. Follow these instructions at home: Medicines  If you were prescribed an antibiotic medicine, take it as told by your doctor. Do not stop taking the antibiotic even if you start to feel better.  Take over-the-counter and prescription medicines only as told by your doctor. Lifestyle  Do not use any products that contain nicotine or tobacco, such as cigarettes and e-cigarettes. If you need help quitting, ask your doctor.  Do not drink alcohol.  Make lifestyle changes as told by your doctor. These may include: ? Getting regular exercise. Ask your doctor for some activities that are safe for you. ? Eating a heart-healthy diet. A diet specialist (dietitian) can help you to learn healthy eating options. ? Staying at a healthy weight. ? Managing diabetes, if needed. ? Lowering your stress, as with deep breathing or spending time in nature. General instructions  Avoid any activities that make you feel chest pain.  If your chest pain is because of heartburn: ? Raise (elevate) the head of your bed about 6 inches (15 cm). You can do this by putting blocks under the bed legs at the head of the bed. ? Do not sleep with extra pillows under your head. That does not help heartburn.  Keep all follow-up visits as told by your doctor. This is important. This includes any further testing if your chest pain does not go away. Contact a doctor if:  Your chest pain does not go away.  You have a rash with blisters on your chest.  You have a fever.  You have chills. Get help right away  if:  Your chest pain is worse.  You have a cough that gets worse, or you cough up blood.  You have very bad (severe) pain in your belly (abdomen).  You are very weak.  You pass out (faint).  You have either of these for no clear reason: ? Sudden chest discomfort. ? Sudden discomfort in your arms, back, neck, or jaw.  You have shortness of breath at any time.  You suddenly start to sweat, or your skin gets clammy.  You feel sick to your stomach (nauseous).  You throw up (vomit).  You suddenly feel light-headed or dizzy.  Your heart starts to beat fast, or it feels like it is skipping beats. These symptoms may be an emergency. Do not wait to see if the symptoms will go away. Get medical help right away. Call your local emergency services (911 in the [place] Do not drive yourself to the hospital. This information is not intended to replace advice given to you by your health care provider. Make sure you discuss any questions you have with your health care provider. Document Released: 10/01/2007 Document Revised: 01/07/2016 Document Reviewed: 01/07/2016 Elsevier Interactive Patient Education  2017 [employer]

## 2017-03-16 NOTE — Progress Notes (Signed)
Subjective:    Patient ID: Leslie Jackson, female    DOB: October 04, 1990, 26 y.o.   MRN: [id number]  [name], a 26 year old female presents for a follow up of an irregular heart beat. She has a history of premature ventricular complexes.  She was informed during a previous pregnancy of an arrhythmia.  She reports periodic shortness of breath on exertion. She denies  abdominal pain, calf pain, chest pain, cough, dizziness, leg swelling, palpitations, rapid heart beat, slow heart beat and syncope.  She does not have any known cardiac risk factors at this time.    Patient is also complaining of hairloss. She says that she has been losing hair over the past several months. She she that hair is also thinning. She endorses weight loss. She denies fatigue, constipation, intolerance to heat/cold, or depression..    Palpitations   This is a chronic problem. Pertinent negatives include no anxiety or chest fullness. Risk factors include family history (Family history of heart disease). There is no history of anemia, drug use, heart disease or hyperthyroidism.    Immunization History  Administered Date(s) Administered  . Tdap 05/10/2015    Social History   Socioeconomic History  . Marital status: Married    Spouse name: Not on file  . Number of children: Not on file  . Years of education: Not on file  . Highest education level: Not on file  Social Needs  . Financial resource strain: Not on file  . Food insecurity - worry: Not on file  . Food insecurity - inability: Not on file  . Transportation needs - medical: Not on file  . Transportation needs - non-medical: Not on file  Occupational History  . Not on file  Tobacco Use  . Smoking status: Never Smoker  . Smokeless tobacco: Never Used  Substance and Sexual Activity  . Alcohol use: No  . Drug use: No  . Sexual activity: Not Currently    Birth control/protection: None, IUD    Comment: planning for IUD  Other Topics  Concern  . Not on file  Social History Narrative  . Not on file   Review of Systems  Constitutional: Negative.  Negative for fatigue.  HENT: Negative.   Eyes: Negative.  Negative for photophobia, redness and visual disturbance.  Respiratory: Negative.   Cardiovascular: Negative for leg swelling.  Gastrointestinal: Negative.  Negative for blood in stool and constipation.  Endocrine: Negative.  Negative for polydipsia, polyphagia and polyuria.  Genitourinary: Negative.   Musculoskeletal: Negative.   Skin: Negative.   Hematological: Negative.   Psychiatric/Behavioral: Negative.  The patient is not nervous/anxious.       Objective:   Physical Exam  Constitutional: She is oriented to person, place, and time. She appears well-developed and well-nourished.  HENT:  Head: Normocephalic and atraumatic.  Right Ear: External ear normal.  Left Ear: External ear normal.  Mouth/Throat: Oropharynx is clear and moist.  Eyes: Conjunctivae and EOM are normal. Pupils are equal, round, and reactive to light.  Neck: Normal range of motion. Neck supple.  Cardiovascular: Normal rate and normal pulses. An irregular rhythm present.  Murmur heard. Pulses:      Carotid pulses are 2+ on the right side, and 2+ on the left side.      Radial pulses are 2+ on the right side, and 2+ on the left side.       Femoral pulses are 2+ on the right side, and 2+ on the  left side.      Popliteal pulses are 2+ on the right side, and 2+ on the left side.       Dorsalis pedis pulses are 2+ on the right side, and 2+ on the left side.       Posterior tibial pulses are 2+ on the right side, and 2+ on the left side.  Pulmonary/Chest: Effort normal and breath sounds normal.  Abdominal: Soft. Bowel sounds are normal. There is tenderness. There is guarding.  Musculoskeletal: Normal range of motion.  Neurological: She is alert and oriented to person, place, and time. She has normal reflexes. She displays tremor (left hand).  Coordination and gait normal.  Skin: Skin is warm and dry.  Psychiatric: She has a normal mood and affect. Her behavior is normal. Judgment and thought content normal.      BP 100/66 (BP Location: Left Arm, Patient Position: Sitting, Cuff Size: Normal)   Pulse 69   Temp 98.5 F (36.9 C) (Oral)   Resp 16   Ht 5\' 4"  (1.626 m)   Wt 130 lb (59 kg)   LMP 03/13/2017   SpO2 100%   BMI 22.31 kg/m  Assessment & Plan:  1. History of irregular heartbeat Patient warrants a cardiology referral due to PVCs. She needs further work-up including a holter monitor Reviewed EKG, abnormal EKG; sinus rhythm with occasional premature ventricular complexes. Will send referral to cardiology for further evaluation. Patient advised to avoid stress, caffeine, cigarette smoke, or illicit drugs  Repeated EKG, sinus rhythm with premature ventricular complexes.  Will send a referral to cardiology.  - EKG 12-Lead - Ambulatory referral to Cardiology  2. Premature ventricular complex - EKG 12-Lead - Ambulatory referral to Cardiology  3. Hair loss - TSH  Routine Health Maintenance:  Last pap smear April 2017 Recommend Monthly self breast exams Recommend barrier protection with sexual intercourse   RTC: Will follow up by phone after reviewing labs,  will schedule follow up at that time.     Nolon NationsLaChina Moore Alexxander Kurt  MSN, FNP-C [facility] Patient [facility] [address]  [place], [place] [id number] [phone]

## 2017-03-17 ENCOUNTER — Telehealth: Payer: Self-pay

## 2017-03-17 ENCOUNTER — Other Ambulatory Visit: Payer: Self-pay | Admitting: Family Medicine

## 2017-03-17 ENCOUNTER — Telehealth: Payer: Self-pay | Admitting: Family Medicine

## 2017-03-17 DIAGNOSIS — L659 Nonscarring hair loss, unspecified: Secondary | ICD-10-CM

## 2017-03-17 NOTE — Telephone Encounter (Signed)
Called and spoke with patient. Advised of normal labs. Advised that we will make referral to dermatology. She wants to wait until She has financial assistance to be referred. [facility] has to have payment up front starting at $198. Patient was made aware to call us when she receives financial assistance.

## 2017-03-17 NOTE — Telephone Encounter (Signed)
-----   Message from Massie MaroonLachina M Hollis, [place] sent at 03/17/2017  8:57 AM EST ----- Regarding: lab results Please inform patient that labs are within a normal range. I will send a referral to dermatology. Will you ask about self pay options with Dr. Terri PiedraLupton and [facility]. Also, advise patient to complete financial assistance forms.   Thanks

## 2017-03-17 NOTE — Telephone Encounter (Signed)
Pt called to speak with the financial dept. Please call her back

## 2017-04-02 ENCOUNTER — Ambulatory Visit: Payer: Self-pay | Attending: Internal Medicine

## 2017-05-25 ENCOUNTER — Ambulatory Visit: Payer: Self-pay | Attending: Internal Medicine

## 2017-06-13 NOTE — Progress Notes (Signed)
Cardiology Office Note   Date:  06/15/2017   ID:  Leslie Jackson, DOB [birth year], MRN [id number]  PCP:  Massie Maroon, FNP  Cardiologist:   Chilton Si, MD   Chief Complaint  Patient presents with  . New Patient (Initial Visit)    pt states sometimes having some pains in her chest area and SOB       History of Present Illness: Leslie Jackson is a 27 y.o. female with PVCs who is being seen today for the evaluation of palpitations at the request of Massie Maroon, FNP.  [name] saw Julianne Handler, FNP, on 02/2017 and reported an irregular heart beat.  She has known PVCs.  EKG at that appointment revealed sinus rhythm with 2 PVCs.  Thyroid function was normal.  She was referred to cardiology for further evaluation.  She has noted palpitations for approximately the last 1 or 2 years.  She feels them every now and then, proximally once per week.  It occurs randomly both at rest and with exertion.  When it happens it lasts for about 5 minutes.  She does sometimes get short of breath and sometimes has chest tightness.  She denies any lightheadedness or dizziness.  She has not experienced any lower extremity edema, orthopnea, or PND.  She does not drink much caffeine and has alcohol approximately once every 2 months.  She denies using any over-the-counter cold or cough medications or illicits.  She does not get much formal exercise but does trace after her 64 and 43-year-old children without any symptoms.  Past Medical History:  Diagnosis Date  . Medical history non-contributory   . No pertinent past medical history     Past Surgical History:  Procedure Laterality Date  . CESAREAN SECTION  07/12/2011   Procedure: CESAREAN SECTION;  Surgeon: Lazaro Arms, MD;  Location: [facility];  Service: Gynecology;  Laterality: N/A;     No current outpatient medications on file.   No current facility-administered medications for this visit.      Allergies:   Patient has no known allergies.    Social History:  The patient  reports that  has never smoked. she has never used smokeless tobacco. She reports that she does not drink alcohol or use drugs.   Family History:  The patient's family history includes Heart disease in her father; Stroke in her father.    ROS:  Please see the history of present illness.   Otherwise, review of systems are positive for none.   All other systems are reviewed and negative.    PHYSICAL EXAM: VS:  BP 107/71 (BP Location: Right Arm)   Pulse 70   Ht 5\' 4"  (1.626 m)   Wt 132 lb (59.9 kg)   SpO2 100%   BMI 22.66 kg/m  , BMI Body mass index is 22.66 kg/m. GENERAL:  Well appearing HEENT:  Pupils equal round and reactive, fundi not visualized, oral mucosa unremarkable NECK:  No jugular venous distention, waveform within normal limits, carotid upstroke brisk and symmetric, no bruits, no thyromegaly LUNGS:  Clear to auscultation bilaterally HEART:  RRR.  PMI not displaced or sustained,S1 and S2 within normal limits, no S3, no S4, no clicks, no rubs, no murmurs ABD:  Flat, positive bowel sounds normal in frequency in pitch, no bruits, no rebound, no guarding, no midline pulsatile mass, no hepatomegaly, no splenomegaly EXT:  2 plus pulses throughout, no edema, no cyanosis no clubbing SKIN:  No rashes no nodules NEURO:  Cranial nerves II through XII grossly intact, motor grossly intact throughout PSYCH:  Cognitively intact, oriented to person place and time   EKG:  EKG is not ordered today. The ekg ordered 03/25/17 demonstrates sinus rhythm.  Rate 72 bpm.    Recent Labs: 09/11/2016: BUN 12; Creat 0.78; Hemoglobin 13.6; Platelets 191; Potassium 3.6; Sodium 139 03/16/2017: TSH 1.13    Lipid Panel No results found for: CHOL, TRIG, HDL, CHOLHDL, VLDL, LDLCALC, LDLDIRECT    Wt Readings from Last 3 Encounters:  06/15/17 132 lb (59.9 kg)  03/16/17 130 lb (59 kg)  09/11/16 132 lb (59.9 kg)       ASSESSMENT AND PLAN:  # PVCs: # Palpitations:  Two PVCs noted on EKG.  No clear etiology. We will get a 7-day event monitor to Better quantify her PVCs.  Thyroid function has been normal.  We will check a CMP, CBC, and magnesium level.  Her blood pressure prohibits the use of beta-blockers.  # CV Disease Prevention: Patient was advised to increase her exercise to 30 min most days of the week.     Current medicines are reviewed at length with the patient today.  The patient does not have concerns regarding medicines.  The following changes have been made:  no change  Labs/ tests ordered today include:   Orders Placed This Encounter  Procedures  . CBC with Differential/Platelet  . Comprehensive metabolic panel  . Magnesium  . CARDIAC EVENT MONITOR     Disposition:   FU with Meryl Hubers C. Duke Salviaandolph, MD, [facility] in 2 months.     This note was written with the assistance of speech recognition software.  Please excuse any transcriptional errors.  Signed, Jag Lenz C. Duke Salviaandolph, MD, [facility]  06/15/2017 9:19 AM    [facility]

## 2017-06-15 ENCOUNTER — Ambulatory Visit (INDEPENDENT_AMBULATORY_CARE_PROVIDER_SITE_OTHER): Payer: No Typology Code available for payment source | Admitting: Cardiovascular Disease

## 2017-06-15 ENCOUNTER — Encounter: Payer: Self-pay | Admitting: Cardiovascular Disease

## 2017-06-15 VITALS — BP 107/71 | HR 70 | Ht 64.0 in | Wt 132.0 lb

## 2017-06-15 DIAGNOSIS — I493 Ventricular premature depolarization: Secondary | ICD-10-CM

## 2017-06-15 LAB — CBC WITH DIFFERENTIAL/PLATELET
BASOS ABS: 0 10*3/uL (ref 0.0–0.2)
Basos: 0 %
EOS (ABSOLUTE): 0.1 10*3/uL (ref 0.0–0.4)
EOS: 1 %
HEMATOCRIT: 41.9 % (ref 34.0–46.6)
Hemoglobin: 14.2 g/dL (ref 11.1–15.9)
IMMATURE GRANULOCYTES: 0 %
Immature Grans (Abs): 0 10*3/uL (ref 0.0–0.1)
LYMPHS ABS: 2.1 10*3/uL (ref 0.7–3.1)
Lymphs: 35 %
MCH: 31.4 pg (ref 26.6–33.0)
MCHC: 33.9 g/dL (ref 31.5–35.7)
MCV: 93 fL (ref 79–97)
MONOS ABS: 0.4 10*3/uL (ref 0.1–0.9)
Monocytes: 7 %
NEUTROS PCT: 57 %
Neutrophils Absolute: 3.4 10*3/uL (ref 1.4–7.0)
PLATELETS: 175 10*3/uL (ref 150–379)
RBC: 4.52 x10E6/uL (ref 3.77–5.28)
RDW: 12.6 % (ref 12.3–15.4)
WBC: 6 10*3/uL (ref 3.4–10.8)

## 2017-06-15 LAB — COMPREHENSIVE METABOLIC PANEL
ALK PHOS: 65 IU/L (ref 39–117)
ALT: 13 IU/L (ref 0–32)
AST: 19 IU/L (ref 0–40)
Albumin/Globulin Ratio: 1.9 (ref 1.2–2.2)
Albumin: 4.6 g/dL (ref 3.5–5.5)
BUN/Creatinine Ratio: 19 (ref 9–23)
BUN: 13 mg/dL (ref 6–20)
Bilirubin Total: 0.5 mg/dL (ref 0.0–1.2)
CALCIUM: 9.5 mg/dL (ref 8.7–10.2)
CO2: 25 mmol/L (ref 20–29)
CREATININE: 0.69 mg/dL (ref 0.57–1.00)
Chloride: 102 mmol/L (ref 96–106)
GFR calc Af Amer: 140 mL/min/{1.73_m2} (ref >59)
GFR, EST NON AFRICAN AMERICAN: 121 mL/min/{1.73_m2} (ref >59)
GLOBULIN, TOTAL: 2.4 g/dL (ref 1.5–4.5)
Glucose: 86 mg/dL (ref 65–99)
Potassium: 4.8 mmol/L (ref 3.5–5.2)
Sodium: 140 mmol/L (ref 134–144)
Total Protein: 7 g/dL (ref 6.0–8.5)

## 2017-06-15 LAB — MAGNESIUM: MAGNESIUM: 1.8 mg/dL (ref 1.6–2.3)

## 2017-06-15 NOTE — Patient Instructions (Addendum)
Medication Instructions:  Your physician recommends that you continue on your current medications as directed. Please refer to the Current Medication list given to you today.  Labwork: MAGNESIUM, CBC, CMET TODAY   Testing/Procedures: Your physician has recommended that you wear an event monitor. Event monitors are medical devices that record the heart's electrical activity. Doctors most often us these monitors to diagnose arrhythmias. Arrhythmias are problems with the speed or rhythm of the heartbeat. The monitor is a small, portable device. You can wear one while you do your normal daily activities. This is usually used to diagnose what is causing palpitations/syncope (passing out). 7 DAY  [facility] AT [address]  Follow-Up: Your physician recommends that you schedule a follow-up appointment in: 2 MONTH OV   If you need a refill on your cardiac medications before your next appointment, please call your pharmacy.   Cardiac Event Monitoring A cardiac event monitor is a small recording device that is used to detect abnormal heart rhythms (arrhythmias). The monitor is used to record your heart rhythm when you have symptoms, such as:  Fast heartbeats (palpitations), such as heart racing or fluttering.  Dizziness.  Fainting or light-headedness.  Unexplained weakness.  Some monitors are wired to electrodes placed on your chest. Electrodes are flat, sticky disks that attach to your skin. Other monitors may be hand-held or worn on the wrist. The monitor can be worn for up to 30 days. If the monitor is attached to your chest, a technician will prepare your chest for the electrode placement and show you how to work the monitor. Take time to practice using the monitor before you leave the office. Make sure you understand how to send the information from the monitor to your health care provider. In some cases, you may need to use a landline telephone instead of a cell phone. What  are the risks? Generally, this device is safe to use, but it possible that the skin under the electrodes will become irritated. How to use your cardiac event monitor  Wear your monitor at all times, except when you are in water: ? Do not let the monitor get wet. ? Take the monitor off when you bathe. Do not swim or use a hot tub with it on.  Keep your skin clean. Do not put body lotion or moisturizer on your chest.  Change the electrodes as told by your health care provider or any time they stop sticking to your skin. You may need to use medical tape to keep them on.  Try to put the electrodes in slightly different places on your chest to help prevent skin irritation. They must remain in the area under your left breast and in the upper right section of your chest.  Make sure the monitor is safely clipped to your clothing or in a location close to your body that your health care provider recommends.  Press the button to record as soon as you feel heart-related symptoms, such as: ? Dizziness. ? Weakness. ? Light-headedness. ? Palpitations. ? Thumping or pounding in your chest. ? Shortness of breath. ? Unexplained weakness.  Keep a diary of your activities, such as walking, doing chores, and taking medicine. It is very important to note what you were doing when you pushed the button to record your symptoms. This will help your health care provider determine what might be contributing to your symptoms.  Send the recorded information as recommended by your health care provider. It may  take some time for your health care provider to process the results.  Change the batteries as told by your health care provider.  Keep electronic devices away from your monitor. This includes: ? Tablets. ? MP3 players. ? Cell phones.  While wearing your monitor you should avoid: ? Electric blankets. ? Firefighter. ? Electric toothbrushes. ? Microwave ovens. ? Magnets. ? Metal detectors. Get  help right away if:  You have chest pain.  You have extreme difficulty breathing or shortness of breath.  You develop a very fast heartbeat that persists.  You develop dizziness that does not go away.  You faint or constantly feel like you are about to faint. Summary  A cardiac event monitor is a small recording device that is used to help detect abnormal heart rhythms (arrhythmias).  The monitor is used to record your heart rhythm when you have heart-related symptoms.  Make sure you understand how to send the information from the monitor to your health care provider.  It is important to press the button on the monitor when you have any heart-related symptoms.  Keep a diary of your activities, such as walking, doing chores, and taking medicine. It is very important to note what you were doing when you pushed the button to record your symptoms. This will help your health care provider learn what might be causing your symptoms. This information is not intended to replace advice given to you by your health care provider. Make sure you discuss any questions you have with your health care provider. Document Released: 01/22/2008 Document Revised: 03/29/2016 Document Reviewed: 03/29/2016 Elsevier Interactive Patient Education  2017 [employer].

## 2017-06-15 NOTE — Addendum Note (Signed)
Addended by: Chana BodeGREEN, Noemi Ishmael L on: 06/15/2017 11:12 AM   Modules accepted: Orders

## 2017-06-22 ENCOUNTER — Other Ambulatory Visit: Payer: Self-pay

## 2017-06-23 ENCOUNTER — Telehealth: Payer: Self-pay | Admitting: Cardiovascular Disease

## 2017-06-23 NOTE — Telephone Encounter (Signed)
-----   Message from Chilton Siiffany [place], MD sent at 06/22/2017  6:50 PM EST ----- Normal blood counts, kidney function and electrolytes.

## 2017-06-23 NOTE — Telephone Encounter (Signed)
New message     [facility]

## 2017-06-23 NOTE — Telephone Encounter (Signed)
Advised patient of lab results  

## 2017-06-24 ENCOUNTER — Ambulatory Visit: Payer: No Typology Code available for payment source

## 2017-06-24 LAB — CYTOLOGY - PAP: Diagnosis: NEGATIVE

## 2017-07-03 ENCOUNTER — Telehealth: Payer: Self-pay | Admitting: Cardiovascular Disease

## 2017-07-03 NOTE — Telephone Encounter (Signed)
07-03-17 Received from DonovanAna K. Vira BrownsSanchez Guerrero the application for [employer].  Leslie AddisonKatie will enrolled the patient and will fax the application to [employer].  Patient is aware she will received a call from the company after they have reviewed the application to let her know if it has or has not been approved.

## 2017-07-07 ENCOUNTER — Encounter: Payer: Self-pay | Admitting: *Deleted

## 2017-07-07 NOTE — Progress Notes (Signed)
Patient ID: Leslie Jackson, female   DOB: [birth year], 27 y.o.   MRN: [id number] Patient enrolled for [employer] to ship a cardiac event monitor to her home pending approval of her [employer] program application.

## 2017-07-30 ENCOUNTER — Ambulatory Visit (INDEPENDENT_AMBULATORY_CARE_PROVIDER_SITE_OTHER): Payer: No Typology Code available for payment source

## 2017-07-30 DIAGNOSIS — I493 Ventricular premature depolarization: Secondary | ICD-10-CM

## 2017-08-24 NOTE — Progress Notes (Signed)
Cardiology Office Note   Date:  08/25/2017   ID:  Leslie Jackson, DOB [birth year], MRN [id number]  PCP:  Leslie Maroon, FNP  Cardiologist:   Leslie Si, MD   No chief complaint on file.     History of Present Illness: Leslie Jackson is a 27 y.o. female with PVCs here for follow-up.  She was initially seen 05/2017 for the evaluation of palpitations.   [name] saw Leslie Handler, FNP, on 02/2017 and reported an irregular heart beat.  She has known PVCs.  EKG at that appointment revealed sinus rhythm with 2 PVCs.  Thyroid function was normal.  She was referred to cardiology for further evaluation.  She has noted palpitations for approximately the last 1 or 2 years.  She wore an ambulatory event monitor but the results are not yet available.  Since that time she has been doing fairly well.  She has palpitations a proximally once per week.  On the day that she has him occur several times throughout the day.  Sometimes it makes it hard for her to sleep at night.  She is had a hard time falling asleep in general and has been using an over-the-counter sleep aid which is helping.  She only drinks 1 cup of coffee daily and this does not seem to exacerbate the palpitations.  She denies any lightheadedness, dizziness, or syncope.  She is walking for exercise proximally 30 minutes/day and has no exertional symptoms.  She denies lower extremity edema, orthopnea, or PND.  Past Medical History:  Diagnosis Date  . Medical history non-contributory   . No pertinent past medical history     Past Surgical History:  Procedure Laterality Date  . CESAREAN SECTION  07/12/2011   Procedure: CESAREAN SECTION;  Surgeon: Leslie Arms, MD;  Location: [facility];  Service: Gynecology;  Laterality: N/A;     Current Outpatient Medications  Medication Sig Dispense Refill  . metoprolol tartrate (LOPRESSOR) 25 MG tablet 1/2 TABLET BY MOUTH AS NEEDED FOR PALPITATIONS 15 tablet 3    No current facility-administered medications for this visit.     Allergies:   Patient has no known allergies.    Social History:  The patient  reports that she has never smoked. She has never used smokeless tobacco. She reports that she does not drink alcohol or use drugs.   Family History:  The patient's family history includes Heart disease in her father; Stroke in her father.    ROS:  Please see the history of present illness.   Otherwise, review of systems are positive for none.   All other systems are reviewed and negative.    PHYSICAL EXAM: VS:  BP 90/60   Pulse 63   Ht  (1.6 m)   Wt 132 lb (59.9 kg)   SpO2 98%   BMI 23.38 kg/m  , BMI Body mass index is 23.38 kg/m. GENERAL:  Well appearing HEENT: Pupils equal round and reactive, fundi not visualized, oral mucosa unremarkable NECK:  No jugular venous distention, waveform within normal limits, carotid upstroke brisk and symmetric, no bruits LUNGS:  Clear to auscultation bilaterally HEART:  RRR.  PMI not displaced or sustained,S1 and S2 within normal limits, no S3, no S4, no clicks, no rubs, no murmurs ABD:  Flat, positive bowel sounds normal in frequency in pitch, no bruits, no rebound, no guarding, no midline pulsatile mass, no hepatomegaly, no splenomegaly EXT:  2 plus pulses throughout, no edema,  no cyanosis no clubbing SKIN:  No rashes no nodules NEURO:  Cranial nerves II through XII grossly intact, motor grossly intact throughout PSYCH:  Cognitively intact, oriented to person place and time   EKG:  EKG is not ordered today. The ekg ordered 03/25/17 demonstrates sinus rhythm.  Rate 72 bpm.    Recent Labs: 03/16/2017: TSH 1.13 06/15/2017: ALT 13; BUN 13; Creatinine, Ser 0.69; Hemoglobin 14.2; Magnesium 1.8; Platelets 175; Potassium 4.8; Sodium 140    Lipid Panel No results found for: CHOL, TRIG, HDL, CHOLHDL, VLDL, LDLCALC, LDLDIRECT    Wt Readings from Last 3 Encounters:  08/25/17 132 lb (59.9 kg)    06/15/17 132 lb (59.9 kg)  03/16/17 130 lb (59 kg)      ASSESSMENT AND PLAN:  # PVCs: # Palpitations:  Labs were unremarkable and symptoms are overall improved.  Her blood pressure is low so we will not start any standing metoprolol.  I will give her prescription for metoprolol tartrate 12.5 mg to be taken as needed when she has persistent palpitations.  We are tracking down her Holter report.  # CV Disease Prevention: Patient was congratulated on increasing her exercise to 30 minutes daily.    Current medicines are reviewed at length with the patient today.  The patient does not have concerns regarding medicines.  The following changes have been made:   PRN metoprolol  Labs/ tests ordered today include:   No orders of the defined types were placed in this encounter.    Disposition:   FU with Leslie Jackson C. Duke Salvia, MD, [facility] in 6 months.       Signed, Leslie Jackson C. Duke Salvia, MD, [facility]  08/25/2017 8:35 AM    [facility]

## 2017-08-25 ENCOUNTER — Ambulatory Visit (INDEPENDENT_AMBULATORY_CARE_PROVIDER_SITE_OTHER): Payer: No Typology Code available for payment source | Admitting: Cardiovascular Disease

## 2017-08-25 ENCOUNTER — Encounter: Payer: Self-pay | Admitting: Cardiovascular Disease

## 2017-08-25 VITALS — BP 90/60 | HR 63 | Ht 63.0 in | Wt 132.0 lb

## 2017-08-25 DIAGNOSIS — I493 Ventricular premature depolarization: Secondary | ICD-10-CM

## 2017-08-25 MED ORDER — METOPROLOL TARTRATE 25 MG PO TABS: ORAL_TABLET | ORAL | 3 refills | Status: DC

## 2017-08-25 NOTE — Patient Instructions (Addendum)
Medication Instructions:  START METOPROLOL TART 25 MG 1/2 TABLET AS NEEDED FOR PALPITATIONS   Labwork: NONE  Testing/Procedures: NONE  Follow-Up: Your physician wants you to follow-up in: 6 MONTH OV  You will receive a reminder letter in the mail two months in advance. If you don't receive a letter, please call our office to schedule the follow-up appointment.  If you need a refill on your cardiac medications before your next appointment, please call your pharmacy.

## 2017-09-04 ENCOUNTER — Telehealth: Payer: Self-pay | Admitting: Radiology

## 2017-09-04 NOTE — Telephone Encounter (Signed)
error 

## 2017-09-14 ENCOUNTER — Ambulatory Visit: Payer: Self-pay | Admitting: Family Medicine

## 2017-09-23 ENCOUNTER — Other Ambulatory Visit: Payer: Self-pay | Admitting: Cardiovascular Disease

## 2017-09-23 DIAGNOSIS — I493 Ventricular premature depolarization: Secondary | ICD-10-CM

## 2017-09-28 ENCOUNTER — Ambulatory Visit (INDEPENDENT_AMBULATORY_CARE_PROVIDER_SITE_OTHER): Payer: No Typology Code available for payment source | Admitting: Family Medicine

## 2017-09-28 ENCOUNTER — Encounter: Payer: Self-pay | Admitting: Family Medicine

## 2017-09-28 VITALS — BP 92/60 | HR 66 | Temp 97.9°F | Resp 14 | Ht 63.0 in | Wt 134.0 lb

## 2017-09-28 DIAGNOSIS — G4700 Insomnia, unspecified: Secondary | ICD-10-CM

## 2017-09-28 DIAGNOSIS — I493 Ventricular premature depolarization: Secondary | ICD-10-CM

## 2017-09-28 DIAGNOSIS — Z8679 Personal history of other diseases of the circulatory system: Secondary | ICD-10-CM

## 2017-09-28 MED ORDER — ZOLPIDEM TARTRATE 5 MG PO TABS: 5.0000 mg | ORAL_TABLET | Freq: Every evening | ORAL | 0 refills | Status: DC | PRN

## 2017-09-28 NOTE — Progress Notes (Signed)
Subjective:    Patient ID: Leslie Jackson, female    DOB: [birth year], 27 y.o.   MRN: [id number]  [name], a 27 year old female presents for a follow up of an irregular heart beat. She has a history of premature ventricular complexes.  She was informed during a previous pregnancy of an arrhythmia.  She reports periodic shortness of breath on exertion. She denies  abdominal pain, calf pain, chest pain, cough, dizziness, leg swelling, palpitations, rapid heart beat, slow heart beat and syncope.  She does not have any known cardiac risk factors at this time.     Palpitations   This is a chronic problem. Pertinent negatives include no anxiety or chest fullness. Risk factors include family history (Family history of heart disease). There is no history of anemia, drug use, heart disease or hyperthyroidism.  Insomnia  Primary symptoms: fragmented sleep, no sleep disturbance, no difficulty falling asleep, no somnolence, no frequent awakening, no premature morning awakening.  The onset quality is sudden. How many beverages per day that contain caffeine: 0 - 1.  The symptoms are relieved by activity. The treatment provided no relief.    Immunization History  Administered Date(s) Administered  . Tdap 05/10/2015    Social History   Socioeconomic History  . Marital status: Married    Spouse name: Not on file  . Number of children: Not on file  . Years of education: Not on file  . Highest education level: Not on file  Occupational History  . Not on file  Social Needs  . Financial resource strain: Not on file  . Food insecurity:    Worry: Not on file    Inability: Not on file  . Transportation needs:    Medical: Not on file    Non-medical: Not on file  Tobacco Use  . Smoking status: Never Smoker  . Smokeless tobacco: Never Used  Substance and Sexual Activity  . Alcohol use: No  . Drug use: No  . Sexual activity: Not Currently    Birth control/protection: None, IUD   Comment: planning for IUD  Lifestyle  . Physical activity:    Days per week: Not on file    Minutes per session: Not on file  . Stress: Not on file  Relationships  . Social connections:    Talks on phone: Not on file    Gets together: Not on file    Attends religious service: Not on file    Active member of club or organization: Not on file    Attends meetings of clubs or organizations: Not on file    Relationship status: Not on file  . Intimate partner violence:    Fear of current or ex partner: Not on file    Emotionally abused: Not on file    Physically abused: Not on file    Forced sexual activity: Not on file  Other Topics Concern  . Not on file  Social History Narrative  . Not on file   Review of Systems  Constitutional: Negative.  Negative for fatigue.  HENT: Negative.   Eyes: Negative.  Negative for photophobia, redness and visual disturbance.  Respiratory: Negative.   Cardiovascular: Positive for palpitations. Negative for leg swelling.  Gastrointestinal: Negative.  Negative for blood in stool and constipation.  Endocrine: Negative.  Negative for polydipsia, polyphagia and polyuria.  Genitourinary: Negative.   Musculoskeletal: Negative.   Skin: Negative.   Hematological: Negative.   Psychiatric/Behavioral: Negative for sleep disturbance. The  patient has insomnia. The patient is not nervous/anxious.       Objective:   Physical Exam  Constitutional: She is oriented to person, place, and time. She appears well-developed and well-nourished.  HENT:  Head: Normocephalic and atraumatic.  Right Ear: External ear normal.  Left Ear: External ear normal.  Mouth/Throat: Oropharynx is clear and moist.  Eyes: Pupils are equal, round, and reactive to light. Conjunctivae and EOM are normal.  Neck: Normal range of motion. Neck supple.  Cardiovascular: Normal rate and normal pulses. An irregular rhythm present.  Murmur heard. Pulses:      Carotid pulses are 2+ on the right  side, and 2+ on the left side.      Radial pulses are 2+ on the right side, and 2+ on the left side.       Femoral pulses are 2+ on the right side, and 2+ on the left side.      Popliteal pulses are 2+ on the right side, and 2+ on the left side.       Dorsalis pedis pulses are 2+ on the right side, and 2+ on the left side.       Posterior tibial pulses are 2+ on the right side, and 2+ on the left side.  Pulmonary/Chest: Effort normal and breath sounds normal.  Abdominal: Soft. Bowel sounds are normal.  Musculoskeletal: Normal range of motion.  Neurological: She is alert and oriented to person, place, and time. She has normal reflexes. Coordination and gait normal.  Skin: Skin is warm and dry.  Psychiatric: She has a normal mood and affect. Her behavior is normal. Judgment and thought content normal.       Assessment & Plan:  1. History of irregular heartbeat Continue to follow up with cardiology as scheduled.  Continue metoprolol as scheduled  2. Premature ventricular complex   3. Insomnia, unspecified type Will start a trial of ambien 5 mg  - zolpidem (AMBIEN) 5 MG tablet; Take 1 tablet (5 mg total) by mouth at bedtime as needed for sleep.  Dispense: 30 tablet; Refill: 0   RTC: 6 months for chronic conditions   Nolon Nations  MSN, FNP-C Patient Care [facility] [address] [place], [place] [address] [phone]

## 2017-09-28 NOTE — Patient Instructions (Signed)
  We will start a trial of Ambien 5 mg.  Take medication at least 1 hour prior to bed.  Give yourself 8 hours of sleep.  Practice the following: Practice good sleep hygiene.  Stick to a sleep schedule, even on weekends. Exercise is great, but not too late in the day Avoid alcoholic drinks before bed Avoid large meals and beverages late before bed Don't take naps after 3 pm. Keep power naps less than 1 hour.  Relax before bed.  Take a hot bath before bed.  Have a good sleeping environment. Get rid of anything in your bedroom that might distract you from sleep.  Adopt good sleeping posture.     Also, for premature ventricular complex, continue metoprolol 25 mg as previously scheduled by cardiology.    Recommend 4737-month follow-up for chronic conditions    Also, follow-up in clinic as needed

## 2017-10-14 ENCOUNTER — Ambulatory Visit: Payer: No Typology Code available for payment source | Admitting: Family Medicine

## 2017-12-30 ENCOUNTER — Ambulatory Visit: Payer: No Typology Code available for payment source

## 2018-01-06 ENCOUNTER — Encounter: Payer: Self-pay | Admitting: Family Medicine

## 2018-01-06 ENCOUNTER — Ambulatory Visit (INDEPENDENT_AMBULATORY_CARE_PROVIDER_SITE_OTHER): Payer: Self-pay | Admitting: Family Medicine

## 2018-01-06 VITALS — BP 102/57 | HR 58 | Temp 97.8°F | Resp 16 | Ht 63.0 in | Wt 137.0 lb

## 2018-01-06 DIAGNOSIS — G629 Polyneuropathy, unspecified: Secondary | ICD-10-CM

## 2018-01-06 MED ORDER — GABAPENTIN 100 MG PO CAPS: 100.0000 mg | ORAL_CAPSULE | Freq: Every day | ORAL | 3 refills | Status: DC

## 2018-01-06 NOTE — Patient Instructions (Signed)
Gabapentin capsules or tablets What is this medicine? GABAPENTIN (GA ba pen tin) is used to control partial seizures in adults with epilepsy. It is also used to treat certain types of nerve pain. This medicine may be used for other purposes; ask your health care provider or pharmacist if you have questions. COMMON BRAND NAME(S): Active-PAC with Gabapentin, Gabarone, Neurontin What should I tell my health care provider before I take this medicine? They need to know if you have any of these conditions: -kidney disease -suicidal thoughts, plans, or attempt; a previous suicide attempt by you or a family member -an unusual or allergic reaction to gabapentin, other medicines, foods, dyes, or preservatives -pregnant or trying to get pregnant -breast-feeding How should I use this medicine? Take this medicine by mouth with a glass of water. Follow the directions on the prescription label. You can take it with or without food. If it upsets your stomach, take it with food.Take your medicine at regular intervals. Do not take it more often than directed. Do not stop taking except on your doctor's advice. If you are directed to break the 600 or 800 mg tablets in half as part of your dose, the extra half tablet should be used for the next dose. If you have not used the extra half tablet within 28 days, it should be thrown away. A special MedGuide will be given to you by the pharmacist with each prescription and refill. Be sure to read this information carefully each time. Talk to your pediatrician regarding the use of this medicine in children. Special care may be needed. Overdosage: If you think you have taken too much of this medicine contact a poison control center or emergency room at once. NOTE: This medicine is only for you. Do not share this medicine with others. What if I miss a dose? If you miss a dose, take it as soon as you can. If it is almost time for your next dose, take only that dose. Do not  take double or extra doses. What may interact with this medicine? Do not take this medicine with any of the following medications: -other gabapentin products This medicine may also interact with the following medications: -alcohol -antacids -antihistamines for allergy, cough and cold -certain medicines for anxiety or sleep -certain medicines for depression or psychotic disturbances -homatropine; hydrocodone -naproxen -narcotic medicines (opiates) for pain -phenothiazines like chlorpromazine, mesoridazine, prochlorperazine, thioridazine This list may not describe all possible interactions. Give your health care provider a list of all the medicines, herbs, non-prescription drugs, or dietary supplements you use. Also tell them if you smoke, drink alcohol, or use illegal drugs. Some items may interact with your medicine. What should I watch for while using this medicine? Visit your doctor or health care professional for regular checks on your progress. You may want to keep a record at home of how you feel your condition is responding to treatment. You may want to share this information with your doctor or health care professional at each visit. You should contact your doctor or health care professional if your seizures get worse or if you have any new types of seizures. Do not stop taking this medicine or any of your seizure medicines unless instructed by your doctor or health care professional. Stopping your medicine suddenly can increase your seizures or their severity. Wear a medical identification bracelet or chain if you are taking this medicine for seizures, and carry a card that lists all your medications. You may get drowsy, dizzy,   or have blurred vision. Do not drive, use machinery, or do anything that needs mental alertness until you know how this medicine affects you. To reduce dizzy or fainting spells, do not sit or stand up quickly, especially if you are an older patient. Alcohol can  increase drowsiness and dizziness. Avoid alcoholic drinks. Your mouth may get dry. Chewing sugarless gum or sucking hard candy, and drinking plenty of water will help. The use of this medicine may increase the chance of suicidal thoughts or actions. Pay special attention to how you are responding while on this medicine. Any worsening of mood, or thoughts of suicide or dying should be reported to your health care professional right away. Women who become pregnant while using this medicine may enroll in the [place] American Antiepileptic Drug Pregnancy Registry by calling [phone]. This registry collects information about the safety of antiepileptic drug use during pregnancy. What side effects may I notice from receiving this medicine? Side effects that you should report to your doctor or health care professional as soon as possible: -allergic reactions like skin rash, itching or hives, swelling of the face, lips, or tongue -worsening of mood, thoughts or actions of suicide or dying Side effects that usually do not require medical attention (report to your doctor or health care professional if they continue or are bothersome): -constipation -difficulty walking or controlling muscle movements -dizziness -nausea -slurred speech -tiredness -tremors -weight gain This list may not describe all possible side effects. Call your doctor for medical advice about side effects. You may report side effects to FDA at 1-800-FDA-1088. Where should I keep my medicine? Keep out of reach of children. This medicine may cause accidental overdose and death if it taken by other adults, children, or pets. Mix any unused medicine with a substance like cat litter or coffee grounds. Then throw the medicine away in a sealed container like a sealed bag or a coffee can with a lid. Do not use the medicine after the expiration date. Store at room temperature between 15 and 30 degrees C (59 and 86 degrees F). NOTE: This  sheet is a summary. It may not cover all possible information. If you have questions about this medicine, talk to your doctor, pharmacist, or health care provider.  2018 Elsevier/Gold Standard (2013-06-10 15:26:50) Peripheral Neuropathy Peripheral neuropathy is a type of nerve damage. It affects nerves that carry signals between the spinal cord and other parts of the body. These are called peripheral nerves. With peripheral neuropathy, one nerve or a group of nerves may be damaged. What are the causes? Many things can damage peripheral nerves. For some people with peripheral neuropathy, the cause is unknown. Some causes include:  Diabetes. This is the most common cause of peripheral neuropathy.  Injury to a nerve.  Pressure or stress on a nerve that lasts a long time.  Too little vitamin B. Alcoholism can lead to this.  Infections.  Autoimmune diseases, such as multiple sclerosis and systemic lupus erythematosus.  Inherited nerve diseases.  Some medicines, such as cancer drugs.  Toxic substances, such as lead and mercury.  Too little blood flowing to the legs.  Kidney disease.  Thyroid disease.  What are the signs or symptoms? Different people have different symptoms. The symptoms you have will depend on which of your nerves is damaged. Common symptoms include:  Loss of feeling (numbness) in the feet and hands.  Tingling in the feet and hands.  Pain that burns.  Very sensitive skin.  Weakness.  Not  being able to move a part of the body (paralysis).  Muscle twitching.  Clumsiness or poor coordination.  Loss of balance.  Not being able to control your bladder.  Feeling dizzy.  Sexual problems.  How is this diagnosed? Peripheral neuropathy is a symptom, not a disease. Finding the cause of peripheral neuropathy can be hard. To figure that out, your health care provider will take a medical history and do a physical exam. A neurological exam will also be done.  This involves checking things affected by your brain, spinal cord, and nerves (nervous system). For example, your health care provider will check your reflexes, how you move, and what you can feel. Other types of tests may also be ordered, such as:  Blood tests.  A test of the fluid in your spinal cord.  Imaging tests, such as CT scans or an MRI.  Electromyography (EMG). This test checks the nerves that control muscles.  Nerve conduction velocity tests. These tests check how fast messages pass through your nerves.  Nerve biopsy. A small piece of nerve is removed. It is then checked under a microscope.  How is this treated?  Medicine is often used to treat peripheral neuropathy. Medicines may include: ? Pain-relieving medicines. Prescription or over-the-counter medicine may be suggested. ? Antiseizure medicine. This may be used for pain. ? Antidepressants. These also may help ease pain from neuropathy. ? Lidocaine. This is a numbing medicine. You might wear a patch or be given a shot. ? Mexiletine. This medicine is typically used to help control irregular heart rhythms.  Surgery. Surgery may be needed to relieve pressure on a nerve or to destroy a nerve that is causing pain.  Physical therapy to help movement.  Assistive devices to help movement. Follow these instructions at home:  Only take over-the-counter or prescription medicines as directed by your health care provider. Follow the instructions carefully for any given medicines. Do not take any other medicines without first getting approval from your health care provider.  If you have diabetes, work closely with your health care provider to keep your blood sugar under control.  If you have numbness in your feet: ? Check every day for signs of injury or infection. Watch for redness, warmth, and swelling. ? Wear padded socks and comfortable shoes. These help protect your feet.  Do not do things that put pressure on your  damaged nerve.  Do not smoke. Smoking keeps blood from getting to damaged nerves.  Avoid or limit alcohol. Too much alcohol can cause a lack of B vitamins. These vitamins are needed for healthy nerves.  Develop a good support system. Coping with peripheral neuropathy can be stressful. Talk to a mental health specialist or join a support group if you are struggling.  Follow up with your health care provider as directed. Contact a health care provider if:  You have new signs or symptoms of peripheral neuropathy.  You are struggling emotionally from dealing with peripheral neuropathy.  You have a fever. Get help right away if:  You have an injury or infection that is not healing.  You feel very dizzy or begin vomiting.  You have chest pain.  You have trouble breathing. This information is not intended to replace advice given to you by your health care provider. Make sure you discuss any questions you have with your health care provider. Document Released: 04/04/2002 Document Revised: 09/20/2015 Document Reviewed: 12/20/2012 Elsevier Interactive Patient Education  2017 [employer].

## 2018-01-06 NOTE — Progress Notes (Signed)
  [facility]   Progress Note: General Provider: Mike Gip, FNP  SUBJECTIVE:   Leslie Jackson is a 27 y.o. female who  has a past medical history of Medical history non-contributory and No pertinent past medical history.. Patient presents today for Arm Pain (tingling sensation in both arms ) and Leg Pain (tingling sensation in both legs )  Patient states that she is having numbness and tingling of upper and lower extremities that occurs mainly at night. States that moving around at night helps to relieve the pain. 4/10 pain States that it feels like her extremities are asleep. Sttes that it starts in the hip and goes to the knees. Shoulders into the finger tips. Denies repetitive movements.  Review of Systems  Constitutional: Negative.   HENT: Negative.   Eyes: Negative.   Respiratory: Negative.   Cardiovascular: Negative.   Gastrointestinal: Negative.   Genitourinary: Negative.   Musculoskeletal: Negative.   Skin: Negative.   Neurological: Positive for tingling (upper and lower extremities).  Psychiatric/Behavioral: Negative.      OBJECTIVE: BP (!) 102/57 (BP Location: Left Arm, Patient Position: Sitting, Cuff Size: Normal)   Pulse (!) 58   Temp 97.8 F (36.6 C) (Oral)   Resp 16   Ht 5\' 3"  (1.6 m)   Wt 137 lb (62.1 kg)   LMP 12/10/2017   SpO2 100%   BMI 24.27 kg/m   Physical Exam  Constitutional: She is oriented to person, place, and time. She appears well-developed and well-nourished. No distress.  HENT:  Head: Normocephalic and atraumatic.  Right Ear: External ear normal.  Left Ear: External ear normal.  Nose: Nose normal.  Mouth/Throat: Oropharynx is clear and moist.  Eyes: Pupils are equal, round, and reactive to light. Conjunctivae and EOM are normal.  Neck: Normal range of motion.  Cardiovascular: Normal rate, normal heart sounds and intact distal pulses. A regularly irregular rhythm present.    Pulmonary/Chest: Effort normal and breath sounds normal. No respiratory distress.  Abdominal: Soft. Bowel sounds are normal. She exhibits no distension.  Musculoskeletal: Normal range of motion.  Neurological: She is alert and oriented to person, place, and time.  Skin: Skin is warm and dry.  Psychiatric: She has a normal mood and affect. Her behavior is normal. Thought content normal.  Nursing note and vitals reviewed.   ASSESSMENT/PLAN: 1. Neuropathy Starting on Gabapentin QHS for nerve pain. Unknown etiology. Labs ordered.  - Vitamin B12 - Thyroid Panel With TSH - Vitamin D, 25-hydroxy - CBC with Differential - Lyme Ab/Western Blot Reflex - Leslie Jackson,IFA RA Diag Pnl w/rflx Tit/Patn - Urinalysis Dipstick - POCT urine pregnancy RTC in 3 weeks.   The patient was given clear instructions to go to ER or return to medical center if symptoms do not improve, worsen or new problems develop. The patient verbalized understanding and agreed with plan of care.   [name]. Leslie Lam, FNP-BC [facility] [address] [place], [place] [address] [phone]     This note has been created with Dragon speech recognition software and smart phrase technology. Any transcriptional errors are unintentional.

## 2018-01-07 ENCOUNTER — Other Ambulatory Visit: Payer: Self-pay

## 2018-01-07 MED ORDER — GABAPENTIN 100 MG PO CAPS: 100.0000 mg | ORAL_CAPSULE | Freq: Every day | ORAL | 3 refills | Status: DC

## 2018-01-08 LAB — CBC WITH DIFFERENTIAL/PLATELET
Basophils Absolute: 0 10*3/uL (ref 0.0–0.2)
Basos: 0 %
EOS (ABSOLUTE): 0.1 10*3/uL (ref 0.0–0.4)
Eos: 1 %
Hematocrit: 41.8 % (ref 34.0–46.6)
Hemoglobin: 13.7 g/dL (ref 11.1–15.9)
Immature Grans (Abs): 0 10*3/uL (ref 0.0–0.1)
Immature Granulocytes: 0 %
Lymphocytes Absolute: 2.3 10*3/uL (ref 0.7–3.1)
Lymphs: 31 %
MCH: 31.4 pg (ref 26.6–33.0)
MCHC: 32.8 g/dL (ref 31.5–35.7)
MCV: 96 fL (ref 79–97)
Monocytes Absolute: 0.6 10*3/uL (ref 0.1–0.9)
Monocytes: 8 %
Neutrophils Absolute: 4.3 10*3/uL (ref 1.4–7.0)
Neutrophils: 60 %
Platelets: 162 10*3/uL (ref 150–450)
RBC: 4.36 x10E6/uL (ref 3.77–5.28)
RDW: 11.8 % — ABNORMAL LOW (ref 12.3–15.4)
WBC: 7.3 10*3/uL (ref 3.4–10.8)

## 2018-01-08 LAB — THYROID PANEL WITH TSH
Free Thyroxine Index: 2.1 (ref 1.2–4.9)
T3 Uptake Ratio: 31 % (ref 24–39)
T4, Total: 6.9 ug/dL (ref 4.5–12.0)
TSH: 2.35 u[IU]/mL (ref 0.450–4.500)

## 2018-01-08 LAB — ANA,IFA RA DIAG PNL W/RFLX TIT/PATN
ANA Titer 1: NEGATIVE
Cyclic Citrullin Peptide Ab: 15 units (ref 0–19)
Rhuematoid fact SerPl-aCnc: <10 IU/mL (ref 0.0–13.9)

## 2018-01-08 LAB — LYME AB/WESTERN BLOT REFLEX
LYME DISEASE AB, QUANT, IGM: <0.8 index (ref 0.00–0.79)
Lyme IgG/IgM Ab: <0.91 {ISR} (ref 0.00–0.90)

## 2018-01-08 LAB — VITAMIN B12: Vitamin B-12: 949 pg/mL (ref 232–1245)

## 2018-01-08 LAB — VITAMIN D 25 HYDROXY (VIT D DEFICIENCY, FRACTURES): Vit D, 25-Hydroxy: 20.9 ng/mL — ABNORMAL LOW (ref 30.0–100.0)

## 2018-01-08 MED FILL — GABAPENTIN 100 MG CAPSULE: 100 | 30 days supply | Qty: 90 | Fill #0

## 2018-01-14 ENCOUNTER — Other Ambulatory Visit: Payer: Self-pay | Admitting: Family Medicine

## 2018-01-14 MED ORDER — VITAMIN D (ERGOCALCIFEROL) 1.25 MG (50000 UNIT) PO CAPS: 50000.0000 [IU] | ORAL_CAPSULE | ORAL | 0 refills | Status: DC

## 2018-01-14 MED FILL — VIT D2 1.25 MG (50,000 UNIT: 1.25 MG | 28 days supply | Qty: 4 | Fill #0

## 2018-01-14 NOTE — Progress Notes (Signed)
[facility] d [facility]

## 2018-01-27 ENCOUNTER — Ambulatory Visit: Payer: Self-pay | Admitting: Family Medicine

## 2018-02-01 ENCOUNTER — Ambulatory Visit: Payer: Self-pay | Attending: Family Medicine

## 2018-02-02 ENCOUNTER — Telehealth: Payer: Self-pay | Admitting: Family Medicine

## 2018-02-02 NOTE — Telephone Encounter (Signed)
Try to contact Pt the VM is full unable to leave msg. To contact the financial dept.

## 2018-04-15 ENCOUNTER — Ambulatory Visit: Payer: No Typology Code available for payment source | Admitting: Family Medicine

## 2018-10-05 ENCOUNTER — Telehealth: Payer: Self-pay

## 2018-10-05 NOTE — Telephone Encounter (Signed)
Called and spoke with patient for COVID 19 Screening. Patient had no risk factors and is cleared to come into office for appointment. Thanks! 

## 2018-10-06 ENCOUNTER — Ambulatory Visit (INDEPENDENT_AMBULATORY_CARE_PROVIDER_SITE_OTHER): Payer: Self-pay | Admitting: Family Medicine

## 2018-10-06 ENCOUNTER — Other Ambulatory Visit: Payer: Self-pay

## 2018-10-06 ENCOUNTER — Encounter: Payer: Self-pay | Admitting: Family Medicine

## 2018-10-06 VITALS — BP 100/61 | HR 55 | Temp 98.8°F | Resp 16 | Ht 63.0 in | Wt 136.0 lb

## 2018-10-06 DIAGNOSIS — E559 Vitamin D deficiency, unspecified: Secondary | ICD-10-CM

## 2018-10-06 DIAGNOSIS — L239 Allergic contact dermatitis, unspecified cause: Secondary | ICD-10-CM

## 2018-10-06 MED ORDER — TRIAMCINOLONE ACETONIDE 0.5 % EX OINT: 1.0000 "application " | TOPICAL_OINTMENT | Freq: Two times a day (BID) | CUTANEOUS | 0 refills | Status: DC

## 2018-10-06 MED ORDER — CETIRIZINE HCL 10 MG PO TABS: 10.0000 mg | ORAL_TABLET | Freq: Every day | ORAL | 11 refills | Status: DC

## 2018-10-06 MED FILL — TRIAMCINOLONE 0.5% OINTMENT: 0.5 | 15 days supply | Qty: 30 | Fill #0

## 2018-10-06 MED FILL — ?CETIRIZINE HCL 10 MG TABLE: 10 | 30 days supply | Qty: 30 | Fill #0

## 2018-10-06 NOTE — Progress Notes (Signed)
[facility]   Progress Note: General Provider: Lanae Boast, FNP  SUBJECTIVE:   Leslie Jackson is a 28 y.o. female who  has a past medical history of Medical history non-contributory and No pertinent past medical history.. Patient presents today for Follow-up (hx of irregular heartbeat ) and Skin Problem (itchy skin under both eyes )  Patient states that she is doing well except she has had swelling and itching under bilateral eyes that is worse in the morning x 2 weeks. She denies a history of allergic rhinitis, new facial products, or contact with new medications. She states that the swelling has mildly decreased over the past few days. She reports compliance with her medications and denies side effects at the present time.   Review of Systems  Constitutional: Negative.   HENT: Negative.   Eyes: Negative.   Respiratory: Negative.   Cardiovascular: Negative.   Gastrointestinal: Negative.   Genitourinary: Negative.   Musculoskeletal: Negative.   Skin: Positive for rash (under eyes).  Neurological: Negative.   Psychiatric/Behavioral: Negative.      OBJECTIVE: BP 100/61 (BP Location: Right Arm, Patient Position: Sitting, Cuff Size: Normal)   Pulse (!) 55   Temp 98.8 F (37.1 C) (Oral)   Resp 16   Ht 5\' 3"  (1.6 m)   Wt 136 lb (61.7 kg)   LMP 09/05/2018   SpO2 99%   BMI 24.09 kg/m   Wt Readings from Last 3 Encounters:  10/06/18 136 lb (61.7 kg)  01/06/18 137 lb (62.1 kg)  09/28/17 134 lb (60.8 kg)     Physical Exam Vitals signs and nursing note reviewed.  Constitutional:      General: She is not in acute distress.    Appearance: Normal appearance.  HENT:     Head: Normocephalic and atraumatic.  Eyes:     Extraocular Movements: Extraocular movements intact.     Conjunctiva/sclera: Conjunctivae normal.     Pupils: Pupils are equal, round, and reactive to light.   Cardiovascular:     Rate and Rhythm:  Normal rate and regular rhythm.     Heart sounds: No murmur.  Pulmonary:     Effort: Pulmonary effort is normal.     Breath sounds: Normal breath sounds.  Musculoskeletal: Normal range of motion.  Skin:    General: Skin is warm and dry.  Neurological:     Mental Status: She is alert and oriented to person, place, and time.  Psychiatric:        Mood and Affect: Mood normal.        Behavior: Behavior normal.        Thought Content: Thought content normal.        Judgment: Judgment normal.     ASSESSMENT/PLAN:  1. Vitamin D deficiency Labs repeated today - VITAMIN D 25 Hydroxy (Vit-D Deficiency, Fractures)  2. Allergic dermatitis Use mild cleanser under the eyes and on face. Can mix triamcinolone with mild lotion such as cetaphil and apply sparingly under the eyes daily.  - cetirizine (ZYRTEC) 10 MG tablet; Take 1 tablet (10 mg total) by mouth daily.  Dispense: 30 tablet; Refill: 11 - triamcinolone ointment (KENALOG) 0.5 %; Apply 1 application topically 2 (two) times daily.  Dispense: 30 g; Refill: 0    Return in about 6 months (around 04/07/2019) for Vitamin d.    The patient was given clear instructions to go to ER or return to medical center if symptoms do not  improve, worsen or new problems develop. The patient verbalized understanding and agreed with plan of care.   Ms. Doug Sou. Nathaneil Canary, FNP-BC [facility] [address] [place], [place] [address] [phone]

## 2018-10-06 NOTE — Patient Instructions (Signed)
Vitamin D Deficiency  Vitamin D deficiency is when your body does not have enough vitamin D. Vitamin D is important because:   It helps your body use other minerals that your body needs.   It helps keep your bones strong and healthy.   It may help to prevent some diseases.   It helps your heart and other muscles work well.  You can get vitamin D by:   Eating foods with vitamin D in them.   Drinking or eating milk or other foods that have had vitamin D added to them.   Taking a vitamin D supplement.   Being in the sun.  Not getting enough vitamin D can make your bones become soft. It can also cause other health problems.  Follow these instructions at home:   Take medicines and supplements only as told by your doctor.   Eat foods that have vitamin D. These include:  ? Dairy products, cereals, or juices with added vitamin D. Check the label for vitamin D.  ? Fatty fish like salmon or trout.  ? Eggs.  ? Oysters.   Do not use tanning beds.   Stay at a healthy weight. Lose weight, if needed.   Keep all follow-up visits as told by your doctor. This is important.  Contact a doctor if:   Your symptoms do not go away.   You feel sick to your stomach (nauseous).   Youthrow up (vomit).   You poop less often than usual or you have trouble pooping (constipation).  This information is not intended to replace advice given to you by your health care provider. Make sure you discuss any questions you have with your health care provider.  Document Released: 04/03/2011 Document Revised: 09/20/2015 Document Reviewed: 08/30/2014  Elsevier Interactive Patient Education  2019 [employer]vier Inc[employer]

## 2018-10-07 ENCOUNTER — Telehealth: Payer: Self-pay

## 2018-10-07 ENCOUNTER — Other Ambulatory Visit: Payer: Self-pay | Admitting: Family Medicine

## 2018-10-07 LAB — VITAMIN D 25 HYDROXY (VIT D DEFICIENCY, FRACTURES): Vit D, 25-Hydroxy: 17.2 ng/mL — ABNORMAL LOW (ref 30.0–100.0)

## 2018-10-07 MED ORDER — VITAMIN D (ERGOCALCIFEROL) 1.25 MG (50000 UNIT) PO CAPS: 50000.0000 [IU] | ORAL_CAPSULE | ORAL | 1 refills | Status: AC

## 2018-10-07 MED FILL — VIT D2 1.25 MG (50,000 UNIT: 1.25 MG | 56 days supply | Qty: 8 | Fill #0

## 2018-10-07 NOTE — Telephone Encounter (Signed)
-----   Message from Lanae Boast, [place] sent at 10/07/2018  8:29 AM EDT ----- Low Vitamin D. All other labs are stable. I will send Vit D to the pharmacy. After completion of the prescription, patient will need otc vitamin D of 1,000units daily.

## 2018-10-07 NOTE — Progress Notes (Signed)
Low Vitamin D. All other labs are stable. I will send Vit D to the pharmacy. After completion of the prescription, patient will need otc vitamin D of 1,000units daily.   

## 2018-10-07 NOTE — Telephone Encounter (Signed)
Called and spoke with patient. Advised that vitamin D was low and that we have prescribed vitamin D supplement once weekly and to take as directed until complete. Advised when finished to take otc vitamin D 1000 units daily. Patient verbalized understanding. Thanks!

## 2018-11-01 ENCOUNTER — Ambulatory Visit: Payer: Self-pay

## 2018-11-15 ENCOUNTER — Ambulatory Visit: Payer: Self-pay | Attending: Family Medicine

## 2018-11-15 ENCOUNTER — Other Ambulatory Visit: Payer: Self-pay

## 2019-01-05 ENCOUNTER — Encounter (HOSPITAL_COMMUNITY): Payer: Self-pay | Admitting: *Deleted

## 2019-01-05 ENCOUNTER — Encounter (HOSPITAL_COMMUNITY): Payer: Self-pay

## 2019-01-17 ENCOUNTER — Other Ambulatory Visit: Payer: Self-pay

## 2019-01-17 ENCOUNTER — Encounter: Payer: Self-pay | Admitting: Cardiology

## 2019-01-17 ENCOUNTER — Ambulatory Visit (INDEPENDENT_AMBULATORY_CARE_PROVIDER_SITE_OTHER): Payer: Self-pay | Admitting: Cardiology

## 2019-01-17 VITALS — BP 94/64 | HR 65 | Ht 63.0 in | Wt 136.0 lb

## 2019-01-17 DIAGNOSIS — R0789 Other chest pain: Secondary | ICD-10-CM

## 2019-01-17 DIAGNOSIS — R079 Chest pain, unspecified: Secondary | ICD-10-CM | POA: Insufficient documentation

## 2019-01-17 DIAGNOSIS — Z8679 Personal history of other diseases of the circulatory system: Secondary | ICD-10-CM

## 2019-01-17 NOTE — Progress Notes (Signed)
Cardiology Office Note:    Date:  01/17/2019   ID:  Leslie Jackson, DOB 1990/06/04, MRN [id number]  PCP:  Mike Gip, FNP  Cardiologist:  Chilton Si, MD  Electrophysiologist:  None   Referring MD: Mike Gip, FNP   Chief Complaint  Patient presents with  . Follow-up  Chest pain at rest two weeks ago  History of Present Illness:    Leslie Jackson is a 28 y.o. female with a hx of palpitations, seen in the past.  Echocardiogram in 2018 showed normal LV function.  Holter monitor in 2019 showed occasional PVCs, she was given a prescription for metoprolol then as needed.  She is seen in the office today after she had an episode of substernal chest pain while at rest a couple weeks ago.  The patient says she was lying in bed awake in the morning when she developed a squeezing type chest pain that lasted 30 to 60 minutes.  She says her hands became sweaty.  She denies any associated nausea vomiting diaphoresis or shortness of breath.  She did not take anything for her chest pain and it resolved spontaneously.  Since then she is been able to go about her activity with no restrictions.  She walks daily and denies any unusual exertional chest pain or shortness of breath.  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  She denies being under unusual stress.  She drinks 2 cups of coffee a day.  She does have a family history of some heart problems although it is not clear what they are.  She says her father has a "heart condition" but has not had surgery.  Her sister has also told her that she has a "heart condition" but has not required any intervention either. She denies any palpitations associated with this recent episode.   Past Medical History:  Diagnosis Date  . Medical history non-contributory   . No pertinent past medical history     Past Surgical History:  Procedure Laterality Date  . CESAREAN SECTION  07/12/2011   Procedure: CESAREAN  SECTION;  Surgeon: Lazaro Arms, MD;  Location: [facility];  Service: Gynecology;  Laterality: N/A;    Current Medications: Current Meds  Medication Sig  . cetirizine (ZYRTEC) 10 MG tablet Take 1 tablet (10 mg total) by mouth daily.  Marland Kitchen gabapentin (NEURONTIN) 100 MG capsule Take 1 capsule (100 mg total) by mouth at bedtime. Start with taking 1 cap x 1 week. 2 cap x 1 week then 3 caps QHS thereafter  . metoprolol tartrate (LOPRESSOR) 25 MG tablet 1/2 TABLET BY MOUTH AS NEEDED FOR PALPITATIONS  . zolpidem (AMBIEN) 5 MG tablet Take 1 tablet (5 mg total) by mouth at bedtime as needed for sleep.     Allergies:   Patient has no known allergies.   Social History   Socioeconomic History  . Marital status: Married    Spouse name: Not on file  . Number of children: Not on file  . Years of education: Not on file  . Highest education level: Not on file  Occupational History  . Not on file  Social Needs  . Financial resource strain: Not on file  . Food insecurity    Worry: Not on file    Inability: Not on file  . Transportation needs    Medical: Not on file    Non-medical: Not on file  Tobacco Use  . Smoking status: Never  Smoker  . Smokeless tobacco: Never Used  Substance and Sexual Activity  . Alcohol use: No  . Drug use: No  . Sexual activity: Not Currently    Birth control/protection: None, I.U.D.    Comment: planning for IUD  Lifestyle  . Physical activity    Days per week: Not on file    Minutes per session: Not on file  . Stress: Not on file  Relationships  . Social Musicianconnections    Talks on phone: Not on file    Gets together: Not on file    Attends religious service: Not on file    Active member of club or organization: Not on file    Attends meetings of clubs or organizations: Not on file    Relationship status: Not on file  Other Topics Concern  . Not on file  Social History Narrative  . Not on file     Family History: The patient's family history includes Heart  disease in her father; Stroke in her father. There is no history of Anesthesia problems, Hypotension, Malignant hyperthermia, Pseudochol deficiency, Hearing loss, Asthma, Cancer, Diabetes, or Hypertension.  ROS:   Please see the history of present illness.   All other systems reviewed and are negative.  EKGs/Labs/Other Studies Reviewed:    The following studies were reviewed today: Echo May 2018  EKG:  EKG is ordered today.  The ekg ordered today demonstrates NSR without acute changes.  Recent Labs: No results found for requested labs within last 8760 hours.  Recent Lipid Panel No results found for: CHOL, TRIG, HDL, CHOLHDL, VLDL, LDLCALC, LDLDIRECT  Physical Exam:    VS:  BP 94/64   Pulse 65   Ht 5\' 3"  (1.6 m)   Wt 136 lb (61.7 kg)   BMI 24.09 kg/m     Wt Readings from Last 3 Encounters:  01/17/19 136 lb (61.7 kg)  10/06/18 136 lb (61.7 kg)  01/06/18 137 lb (62.1 kg)     GEN:  Well nourished, well developed in no acute distress HEENT: Normal NECK: No JVD; No carotid bruits LYMPHATICS: No lymphadenopathy CARDIAC: RRR, no murmurs, rubs, gallops RESPIRATORY:  Clear to auscultation without rales, wheezing or rhonchi  ABDOMEN: Soft, non-tender, non-distended MUSCULOSKELETAL:  No edema; No deformity  SKIN: Warm and dry NEUROLOGIC:  Alert and oriented x 3 PSYCHIATRIC:  Normal affect   ASSESSMENT:    Chest pain of uncertain etiology Pt seen today after she had an episode of SSCP two weeks ago while at rest  History of irregular heartbeat Seen in 2019- occasional PVC on Holter- lopressor PRN  PLAN:    Reassurance.  I suggested she decrease her caffeine intake.  If her symptoms recur or she develops any exertional symptoms consider more aggressive work up.  F/U Dr Duke Salviaandolph 3-4 months.   Medication Adjustments/Labs and Tests Ordered: Current medicines are reviewed at length with the patient today.  Concerns regarding medicines are outlined above.  Orders Placed This  Encounter  Procedures  . EKG 12-Lead   No orders of the defined types were placed in this encounter.   Patient Instructions  Medication Instructions:  Your physician recommends that you continue on your current medications as directed. Please refer to the Current Medication list given to you today.  If you need a refill on your cardiac medications before your next appointment, please call your pharmacy.   Lab work: NONE  If you have labs (blood work) drawn today and your tests are completely normal, you  will receive your results only by: Marland Kitchen MyChart Message (if you have MyChart) OR . A paper copy in the mail If you have any lab test that is abnormal or we need to change your treatment, we will call you to review the results.  Testing/Procedures: NONE   Follow-Up: At [facility], you and your health needs are our priority.  As part of our continuing mission to provide you with exceptional heart care, we have created designated Provider Care Teams.  These Care Teams include your primary Cardiologist (physician) and Advanced Practice Providers (APPs -  Physician Assistants and Nurse Practitioners) who all work together to provide you with the care you need, when you need it. You will need a follow up appointment in 4 months.  Please call our office 2 months in advance to schedule this appointment.  You may see DR [facility] or one of the following Advanced Practice Providers on your designated Care Team:   Kerin Ransom, PA-C Roby Lofts, [place] . Sande Rives, PA-C  Any Other Special Instructions Will Be Listed Below (If Applicable).      Signed, Kerin Ransom, PA-C  01/17/2019 2:16 PM    [facility]

## 2019-01-17 NOTE — Assessment & Plan Note (Signed)
Seen in 2019- occasional PVC on Holter- lopressor PRN

## 2019-01-17 NOTE — Assessment & Plan Note (Signed)
Pt seen today after she had an episode of SSCP two weeks ago while at rest

## 2019-01-17 NOTE — Patient Instructions (Signed)
Medication Instructions:  Your physician recommends that you continue on your current medications as directed. Please refer to the Current Medication list given to you today.  If you need a refill on your cardiac medications before your next appointment, please call your pharmacy.   Lab work: NONE  If you have labs (blood work) drawn today and your tests are completely normal, you will receive your results only by: Marland Kitchen MyChart Message (if you have MyChart) OR . A paper copy in the mail If you have any lab test that is abnormal or we need to change your treatment, we will call you to review the results.  Testing/Procedures: NONE   Follow-Up: At [facility], you and your health needs are our priority.  As part of our continuing mission to provide you with exceptional heart care, we have created designated Provider Care Teams.  These Care Teams include your primary Cardiologist (physician) and Advanced Practice Providers (APPs -  Physician Assistants and Nurse Practitioners) who all work together to provide you with the care you need, when you need it. You will need a follow up appointment in 4 months.  Please call our office 2 months in advance to schedule this appointment.  You may see DR [facility] [place] or one of the following Advanced Practice Providers on your designated Care Team:   Kerin Ransom, PA-C Roby Lofts, [place] . Sande Rives, PA-C  Any Other Special Instructions Will Be Listed Below (If Applicable).

## 2019-04-07 ENCOUNTER — Ambulatory Visit: Payer: Self-pay | Admitting: Family Medicine

## 2019-04-26 NOTE — Progress Notes (Signed)
This encounter was created in error - please disregard.

## 2019-04-27 ENCOUNTER — Encounter: Payer: Self-pay | Admitting: Cardiovascular Disease

## 2019-06-07 ENCOUNTER — Ambulatory Visit: Payer: Self-pay | Admitting: Family Medicine

## 2019-11-09 ENCOUNTER — Emergency Department (HOSPITAL_COMMUNITY)
Admission: EM | Admit: 2019-11-09 | Discharge: 2019-11-10 | Disposition: A | Discharge status: Home / Self Care | Payer: Self-pay | Attending: Emergency Medicine | Admitting: Emergency Medicine

## 2019-11-09 ENCOUNTER — Other Ambulatory Visit: Payer: Self-pay

## 2019-11-09 DIAGNOSIS — T7840XA Allergy, unspecified, initial encounter: Secondary | ICD-10-CM | POA: Insufficient documentation

## 2019-11-09 MED ORDER — METHYLPREDNISOLONE SODIUM SUCC 125 MG IJ SOLR
125.0000 mg | Freq: Once | INTRAMUSCULAR | Status: AC
  Administered 2019-11-09: 125 mg via INTRAVENOUS
  Filled 2019-11-09: qty 2

## 2019-11-09 MED ORDER — SODIUM CHLORIDE 0.9 % IV BOLUS
1000.0000 mL | Freq: Once | INTRAVENOUS | Status: AC
  Administered 2019-11-09: 1000 mL via INTRAVENOUS

## 2019-11-09 MED ORDER — FAMOTIDINE IN NACL 20-0.9 MG/50ML-% IV SOLN
20.0000 mg | INTRAVENOUS | Status: AC
  Administered 2019-11-09: 20 mg via INTRAVENOUS
  Filled 2019-11-09: qty 50

## 2019-11-09 NOTE — ED Provider Notes (Signed)
[facility] EMERGENCY DEPARTMENT Provider Note   CSN: [id number] Arrival date & time: 11/09/19  2227     History Chief Complaint  Patient presents with   Allergic Reaction    Leslie Jackson is a 29 y.o. female.  The history is provided by the patient and medical records.  Allergic Reaction Presenting symptoms: rash     29 year old female with history of irregular heartbeat, presenting to the ED for allergic reaction.  States she ate some imitation crab meat and shrimp around 6:30 PM.  States around 830 she developed itching of both arms, sensation of lip and tongue swelling, difficulty swallowing.  EMS was called and administered EpiPen along with 50 mg IV Benadryl.  On arrival, patient reports symptoms are improving, she is swallowing much better at this time.  Still feels like her lips and tongue are mildly swollen.  States she has eaten seafood in the past without any issue.  She has no known other allergies.  Denies any other new foods or medications.  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   Pregnancy 07/26/2015   History of C-section 03/09/2015   Thrombocytopenia (HCC) 03/09/2015   Wound dehiscence, cesarean 08/13/2011    Past Surgical History:  Procedure Laterality Date   CESAREAN SECTION  07/12/2011   Procedure: CESAREAN SECTION;  Surgeon: Lazaro Arms, MD;  Location: [facility];  Service: Gynecology;  Laterality: N/A;     OB History    Gravida  2   Para  2   Term  2   Preterm  0   AB  0   Living  2     SAB  0   TAB  0   Ectopic  0   Multiple  0   Live Births  2           Family History  Problem Relation Age of Onset   Heart disease Father      Stroke Father    Anesthesia problems Neg Hx    Hypotension Neg Hx    Malignant hyperthermia Neg Hx    Pseudochol deficiency Neg Hx    Hearing loss Neg Hx    Asthma Neg Hx    Cancer Neg Hx    Diabetes Neg Hx    Hypertension Neg Hx     Social History   Tobacco Use   Smoking status: Never Smoker   Smokeless tobacco: Never Used  Vaping Use   Vaping Use: Never used  Substance Use Topics   Alcohol use: No   Drug use: No    Home Medications Prior to Admission medications   Medication Sig Start Date End Date Taking? Authorizing Provider  cetirizine (ZYRTEC) 10 MG tablet Take 1 tablet (10 mg total) by mouth daily. 10/06/18   Mike Gip, FNP  gabapentin (NEURONTIN) 100 MG capsule Take 1 capsule (100 mg total) by mouth at bedtime. Start with taking 1 cap x 1 week. 2 cap x 1 week then 3 caps QHS thereafter 01/07/18   Mike Gip, FNP  metoprolol tartrate (LOPRESSOR) 25 MG tablet 1/2 TABLET BY MOUTH AS NEEDED FOR PALPITATIONS 08/25/17   Chilton Si, MD  zolpidem (AMBIEN) 5 MG tablet Take 1 tablet (  5 mg total) by mouth at bedtime as needed for sleep. 09/28/17   Massie Maroon, FNP    Allergies    Patient has no known allergies.  Review of Systems   Review of Systems  Constitutional:       Allergic reaction  Skin: Positive for rash.  All other systems reviewed and are negative.   Physical Exam Updated Vital Signs BP 121/73    Pulse 82    Temp 98.3 F (36.8 C) (Oral)    Resp 18    Ht 5\' 3"  (1.6 m)    Wt 65.8 kg    SpO2 99%    BMI 25.69 kg/m   Physical Exam Vitals and nursing note reviewed.  Constitutional:      Appearance: She is well-developed.  HENT:     Head: Normocephalic and atraumatic.     Mouth/Throat:     Comments: Mild swelling of the upper and lower lips, no significant tongue swelling, no neck swelling, handling secretions well, normal phonation without stridor Eyes:     Conjunctiva/sclera: Conjunctivae normal.     Pupils: Pupils are  equal, round, and reactive to light.  Cardiovascular:     Rate and Rhythm: Normal rate and regular rhythm.     Heart sounds: Normal heart sounds.  Pulmonary:     Effort: Pulmonary effort is normal.     Breath sounds: Normal breath sounds.  Abdominal:     General: Bowel sounds are normal.     Palpations: Abdomen is soft.  Musculoskeletal:        General: Normal range of motion.     Cervical back: Normal range of motion.  Skin:    General: Skin is warm and dry.     Comments: Splotchy, urticarial rash noted on the upper extremities, small amount on the chest and neck  Neurological:     Mental Status: She is alert and oriented to person, place, and time.     ED Results / Procedures / Treatments   Labs (all labs ordered are listed, but only abnormal results are displayed) Labs Reviewed - No data to display  EKG None  Radiology No results found.  Procedures Procedures (including critical care time)  CRITICAL CARE Performed by:   Total critical care time: 35 minutes  Critical care time was exclusive of separately billable procedures and treating other patients.  Critical care was necessary to treat or prevent imminent or life-threatening deterioration.  Critical care was time spent personally by me on the following activities: development of treatment plan with patient and/or surrogate as well as nursing, discussions with consultants, evaluation of patient's response to treatment, examination of patient, obtaining history from patient or surrogate, ordering and performing treatments and interventions, ordering and review of laboratory studies, ordering and review of radiographic studies, pulse oximetry and re-evaluation of patient's condition.   Medications Ordered in ED Medications  methylPREDNISolone sodium succinate (SOLU-MEDROL) 125 mg/2 mL injection 125 mg (125 mg Intravenous Given 11/09/19 2250)  famotidine (PEPCID) IVPB 20 mg premix (0 mg Intravenous  Stopped 11/09/19 2329)  sodium chloride 0.9 % bolus 1,000 mL (0 mLs Intravenous Stopped 11/10/19 0120)    ED Course  I have reviewed the triage vital signs and the nursing notes.  Pertinent labs & imaging results that were available during my care of the patient were reviewed by me and considered in my medical decision making (see chart for details).    MDM Rules/Calculators/A&P  29 y.o. F here  with allergic reaction after eating imitation crab meat and shrimp around 6:30pm, onset of symptoms 8:30pm.  No hx of allergies to seafood in the past.  No other new foods, medications, soaps, detergents, etc.  Given Epi and benadryl by EMS, reports improvement.  Still has some lip swelling.  She is afebrile, non-toxic, hemodynamically stable.  She does have some mild swelling of the lips but no significant tongue swelling.  She is handling her secretions well, normal phonation without stridor.  Splotchy rash noted to the upper extremities, chest, and neck.  EKG sinus rhythm, no acute abnormalities.  As she was given epinephrine, will observe here.  Given additional Pepcid and Solu-Medrol.  Will reassess.  2:21 AM Patient has been observed for 4 hours post epi.  Lips are no longer swollen and rash has completely dissipated.  She continues handling secretions well without stridor.  VSS.  Feel she is stable for discharge.  Given oral symptoms, will plan to d/c home with epi pen and steroid taper.  We have discussed indications for EpiPen use and need for ER evaluation afterwards.  She will follow-up closely with her primary care doctor.  She may return here for any new or acute changes.  Final Clinical Impression(s) / ED Diagnoses Final diagnoses:  Allergic reaction, initial encounter    Rx / DC Orders ED Discharge Orders         Ordered    EPINEPHrine (EPIPEN 2-PAK) 0.3 mg/0.3 mL IJ SOAJ injection  Once PRN     Discontinue  Reprint     11/10/19 0219    predniSONE (DELTASONE) 20 MG tablet      Discontinue  Reprint     11/10/19 0219           Garlon Hatchet, PA-C 11/10/19 0222    Melene Plan, DO 11/10/19 [phone]

## 2019-11-09 NOTE — ED Triage Notes (Signed)
Pt arrives via EMS for home where pt was eating imitation crab meat and shrimp when she began having difficulty swallowing, hives, and her lips began to swell. Pt has no history of allergies to seafood. Pt received 0.3 EPI and 50 benadryl by EMS. Pt reports she can  Swallow with ease and pt is in NAD at this time.

## 2019-11-10 MED ORDER — PREDNISONE 20 MG PO TABS: ORAL_TABLET | ORAL | 0 refills | Status: DC

## 2019-11-10 MED ORDER — EPINEPHRINE 0.3 MG/0.3ML IJ SOAJ: 0.3000 mg | Freq: Once | INTRAMUSCULAR | 1 refills | Status: DC | PRN

## 2019-11-10 NOTE — Discharge Instructions (Signed)
I would avoid seafood for now. Take the prescribed medication as directed.  Can continue Benadryl if needed for recurrent rash and/or itching. Keep EpiPen with you.  Use for reaction involving lip or tongue swelling, difficulty swallowing, shortness of breath.  If used, you need to come to the ER for evaluation and monitoring. Follow-up with your primary care doctor. Return to the ED for new or worsening symptoms.

## 2019-11-10 NOTE — ED Notes (Signed)
Discharge instructions reviewed with pt. Pt verbalized understanding.   

## 2020-12-17 ENCOUNTER — Telehealth: Payer: Self-pay | Admitting: *Deleted

## 2020-12-17 NOTE — Telephone Encounter (Signed)
Copied from CRM [id number]. Topic: Appointment Scheduling - Scheduling Inquiry for Clinic >> Dec 13, 2020  1:59 PM Marylen Ponto wrote: Reason for CRM: Pt called to schedule appt for orange card. Pt requests call back. Cb# [phone]

## 2020-12-21 ENCOUNTER — Other Ambulatory Visit: Payer: Self-pay

## 2020-12-21 ENCOUNTER — Ambulatory Visit (INDEPENDENT_AMBULATORY_CARE_PROVIDER_SITE_OTHER): Payer: Self-pay | Admitting: Nurse Practitioner

## 2020-12-21 ENCOUNTER — Encounter: Payer: Self-pay | Admitting: Nurse Practitioner

## 2020-12-21 VITALS — BP 104/70 | HR 71 | Temp 97.2°F | Ht 63.0 in | Wt 149.0 lb

## 2020-12-21 DIAGNOSIS — Z8679 Personal history of other diseases of the circulatory system: Secondary | ICD-10-CM

## 2020-12-21 DIAGNOSIS — N814 Uterovaginal prolapse, unspecified: Secondary | ICD-10-CM

## 2020-12-21 DIAGNOSIS — R519 Headache, unspecified: Secondary | ICD-10-CM

## 2020-12-21 DIAGNOSIS — G8929 Other chronic pain: Secondary | ICD-10-CM

## 2020-12-21 DIAGNOSIS — Z01419 Encounter for gynecological examination (general) (routine) without abnormal findings: Secondary | ICD-10-CM

## 2020-12-21 LAB — POCT URINALYSIS DIPSTICK
Bilirubin, UA: NEGATIVE
Glucose, UA: NEGATIVE
Ketones, UA: NEGATIVE
Leukocytes, UA: NEGATIVE
Nitrite, UA: NEGATIVE
Protein, UA: NEGATIVE
Spec Grav, UA: 1.015 (ref 1.010–1.025)
Urobilinogen, UA: 0.2 E.U./dL
pH, UA: 7 (ref 5.0–8.0)

## 2020-12-21 LAB — POCT URINE PREGNANCY: Preg Test, Ur: NEGATIVE

## 2020-12-21 MED ORDER — BUTALBITAL-APAP-CAFF-COD 50-325-40-30 MG PO CAPS
1.0000 | ORAL_CAPSULE | Freq: Four times a day (QID) | ORAL | 0 refills | Status: DC | PRN
Start: 2020-12-21 — End: 2020-12-28

## 2020-12-21 NOTE — Progress Notes (Addendum)
[facility] Patient [facility] [name] [place], [place]  [address] Phone:  [phone]   Fax:  [phone] Subjective:   Patient ID: Leslie Jackson, female    DOB: December 11, 1990, 30 y.o.   MRN: [id number]  Chief Complaint  Patient presents with   Follow-up    PAP; 3 week headache stated it has not gone away. Feels like her cervix comes down noticed 2 months ago    HPI Leslie Jackson 30 y.o. female presents with thrombocytopenia, irregular heartbeat and anxiety to the [facility] for intractable headache and Pap smear.  Patient states she has had a generalized headache x3 weeks.  Has been taking over-the-counter medications with no improvement in symptoms.  Describes headache as throbbing.  Rates pain a 5 out of 10, and fluctuating throughout the day.  Pain worsens with loud noises.  Patient denies any sensitivity to light, nausea or changes in vision.  Denies any recent trauma or injury.  Patient denies having a history of migraines.  Patient also endorses having episodes of what feels like cervical prolapse.  She says that it is occurred to her 3 times in the past month, "it feels like my cervix drops."  She states that when it occurs, she pushes her cervix back in and no longer has any symptoms.  Denies any other vaginal discomfort, discharge or odor.  Denies any burning with urination.  Has had 1 partner, unprotected in the past 6 months.  Currently on IUD for birth control.  Last menstrual cycle 5 days ago.  Has not had recent follow-up with cardiology due to timing constraints.  Denies any dizziness, chest pain or shortness of breath.  Denies any fever.    Past Medical History:  Diagnosis Date   Medical history non-contributory    No pertinent past medical history     Past Surgical History:  Procedure Laterality Date   CESAREAN SECTION  07/12/2011   Procedure: CESAREAN SECTION;  Surgeon: Lazaro Arms, MD;  Location: [facility];  Service: Gynecology;  Laterality:  N/A;    Family History  Problem Relation Age of Onset   Heart disease Father    Stroke Father    Anesthesia problems Neg Hx    Hypotension Neg Hx    Malignant hyperthermia Neg Hx    Pseudochol deficiency Neg Hx    Hearing loss Neg Hx    Asthma Neg Hx    Cancer Neg Hx    Diabetes Neg Hx    Hypertension Neg Hx     Social History   Socioeconomic History   Marital status: Married    Spouse name: Not on file   Number of children: Not on file   Years of education: Not on file   Highest education level: Not on file  Occupational History   Not on file  Tobacco Use   Smoking status: Never   Smokeless tobacco: Never  Vaping Use   Vaping Use: Never used  Substance and Sexual Activity   Alcohol use: No   Drug use: No   Sexual activity: Not Currently    Birth control/protection: None, I.U.D.    Comment: planning for IUD  Other Topics Concern   Not on file  Social History Narrative   Not on file   Social Determinants of Health   Financial Resource Strain: Not on file  Food Insecurity: Not on file  Transportation Needs: Not on file  Physical Activity: Not on file  Stress: Not  on file  Social Connections: Not on file  Intimate Partner Violence: Not on file    Outpatient Medications Prior to Visit  Medication Sig Dispense Refill   EPINEPHrine (EPIPEN 2-PAK) 0.3 mg/0.3 mL IJ SOAJ injection Inject 0.3 mLs (0.3 mg total) into the muscle once as needed for up to 1 dose (for severe allergic reaction). CAll 911 immediately if you have to use this medicine 1 each 1   gabapentin (NEURONTIN) 100 MG capsule Take 1 capsule (100 mg total) by mouth at bedtime. Start with taking 1 cap x 1 week. 2 cap x 1 week then 3 caps QHS thereafter (Patient not taking: Reported on 12/21/2020) 90 capsule 3   metoprolol tartrate (LOPRESSOR) 25 MG tablet 1/2 TABLET BY MOUTH AS NEEDED FOR PALPITATIONS (Patient not taking: Reported on 12/21/2020) 15 tablet 3   zolpidem (AMBIEN) 5 MG tablet Take 1 tablet (5  mg total) by mouth at bedtime as needed for sleep. (Patient not taking: Reported on 12/21/2020) 30 tablet 0   cetirizine (ZYRTEC) 10 MG tablet Take 1 tablet (10 mg total) by mouth daily. 30 tablet 11   predniSONE (DELTASONE) 20 MG tablet Take 40 mg by mouth daily for 3 days, then 20mg  by mouth daily for 3 days, then 10mg  daily for 3 days 12 tablet 0   No facility-administered medications prior to visit.    No Known Allergies  Review of Systems  Constitutional:  Negative for chills, fever and malaise/fatigue.  Respiratory:  Negative for cough and shortness of breath.   Cardiovascular:  Negative for chest pain, palpitations and leg swelling.  Gastrointestinal:  Negative for abdominal pain, blood in stool, constipation, diarrhea, nausea and vomiting.  Genitourinary:  Negative for dysuria, flank pain, frequency, hematuria and urgency.       Periods of cervical prolapse  Skin: Negative.   Neurological:  Positive for headaches. Negative for dizziness, tingling, sensory change, focal weakness and weakness.  Psychiatric/Behavioral:  Negative for depression. The patient is not nervous/anxious.   All other systems reviewed and are negative.     Objective:    Physical Exam Vitals reviewed. Exam conducted with a chaperone present.  Constitutional:      General: She is not in acute distress.    Appearance: Normal appearance.  HENT:     Head: Normocephalic.  Cardiovascular:     Rate and Rhythm: Normal rate and regular rhythm.     Pulses: Normal pulses.     Heart sounds: Normal heart sounds.     Comments: No obvious peripheral edema Pulmonary:     Effort: Pulmonary effort is normal.     Breath sounds: Normal breath sounds.  Chest:  Breasts:    Right: Normal.     Left: Normal.  Genitourinary:    Labia:        Right: No tenderness or lesion.        Left: No tenderness.      Vagina: Bleeding present.     Cervix: Cervical bleeding present. No cervical motion tenderness or erythema.      Uterus: Normal.      Comments: Small amount of bloody discharge, consistent with menstrual cycle Lymphadenopathy:     Upper Body:     Right upper body: No axillary adenopathy.     Left upper body: No axillary adenopathy.  Skin:    General: Skin is warm and dry.     Capillary Refill: Capillary refill takes less than 2 seconds.  Neurological:     General:  No focal deficit present.     Mental Status: She is alert and oriented to person, place, and time.     Cranial Nerves: No cranial nerve deficit.     Sensory: No sensory deficit.     Motor: No weakness.     Gait: Gait normal.  Psychiatric:        Mood and Affect: Mood normal.        Behavior: Behavior normal.        Thought Content: Thought content normal.        Judgment: Judgment normal.    BP 104/70 (BP Location: Right Arm, Patient Position: Sitting)   Pulse 71   Temp (!) 97.2 F (36.2 C)   Ht 5\' 3"  (1.6 m)   Wt 149 lb 0.6 oz (67.6 kg)   LMP 12/17/2020   SpO2 100%   Breastfeeding No   BMI 26.40 kg/m  Wt Readings from Last 3 Encounters:  12/21/20 149 lb 0.6 oz (67.6 kg)  11/09/19 145 lb (65.8 kg)  01/17/19 136 lb (61.7 kg)    Immunization History  Administered Date(s) Administered   Tdap 05/10/2015    Diabetic Foot Exam - Simple   No data filed     Lab Results  Component Value Date   TSH 2.350 01/06/2018   Lab Results  Component Value Date   WBC 7.3 01/06/2018   HGB 13.7 01/06/2018   HCT 41.8 01/06/2018   MCV 96 01/06/2018   PLT 162 01/06/2018   Lab Results  Component Value Date   NA 140 06/15/2017   K 4.8 06/15/2017   CO2 25 06/15/2017   GLUCOSE 86 06/15/2017   BUN 13 06/15/2017   CREATININE 0.69 06/15/2017   BILITOT 0.5 06/15/2017   ALKPHOS 65 06/15/2017   AST 19 06/15/2017   ALT 13 06/15/2017   PROT 7.0 06/15/2017   ALBUMIN 4.6 06/15/2017   CALCIUM 9.5 06/15/2017   ANIONGAP 10 03/09/2015   No results found for: CHOL No results found for: HDL No results found for: LDLCALC No results  found for: TRIG No results found for: CHOLHDL No results found for: 13/02/2015     Assessment & Plan:   Problem List Items Addressed This Visit       Other   History of irregular heartbeat   Relevant Orders   Ambulatory referral to Cardiology for follow up    Other Visit Diagnoses     Encounter for well woman exam with routine gynecological exam    -  Primary   Relevant Orders   Pap IG and Chlamydia/Gonococcus, NAA ([facility])   NuSwab BV and Candida, NAA   HIV antibody (with reflex)   CBC with Differential   Comprehensive metabolic panel   Lipid Panel   POCT urine pregnancy (Completed)   Urinalysis Dipstick (Completed)   Chronic intractable headache, unspecified headache type       Relevant Medications   butalbital-acetaminophen-caffeine (FIORICET WITH CODEINE) 50-325-40-30 MG capsule Tension HA v migraine HA, will trial Fioricet, patient to follow up in 8 wks for reevaluation of symptoms    Vaginal and cervical prolapse       Relevant Orders   Ambulatory referral to Gynecology for evaluation of intermittent cervical prolapse       I have discontinued Ileta K. [id number] Guerrero's cetirizine and predniSONE. I am also having her start on butalbital-acetaminophen-caffeine. Additionally, I am having her maintain her metoprolol tartrate, zolpidem, gabapentin, and EPINEPHrine.  Meds ordered this encounter  Medications  butalbital-acetaminophen-caffeine (FIORICET WITH CODEINE) 50-325-40-30 MG capsule    Sig: Take 1 capsule by mouth every 6 (six) hours as needed for headache.    Dispense:  15 capsule    Refill:  0     Kathrynn Speed, NP

## 2020-12-22 LAB — CBC WITH DIFFERENTIAL/PLATELET
Basophils Absolute: 0 10*3/uL (ref 0.0–0.2)
Basos: 1 %
EOS (ABSOLUTE): 0.1 10*3/uL (ref 0.0–0.4)
Eos: 1 %
Hematocrit: 40.7 % (ref 34.0–46.6)
Hemoglobin: 13.9 g/dL (ref 11.1–15.9)
Immature Grans (Abs): 0 10*3/uL (ref 0.0–0.1)
Immature Granulocytes: 0 %
Lymphocytes Absolute: 2 10*3/uL (ref 0.7–3.1)
Lymphs: 33 %
MCH: 31 pg (ref 26.6–33.0)
MCHC: 34.2 g/dL (ref 31.5–35.7)
MCV: 91 fL (ref 79–97)
Monocytes Absolute: 0.4 10*3/uL (ref 0.1–0.9)
Monocytes: 7 %
Neutrophils Absolute: 3.6 10*3/uL (ref 1.4–7.0)
Neutrophils: 58 %
Platelets: 188 10*3/uL (ref 150–450)
RBC: 4.49 x10E6/uL (ref 3.77–5.28)
RDW: 11.5 % — ABNORMAL LOW (ref 11.7–15.4)
WBC: 6.1 10*3/uL (ref 3.4–10.8)

## 2020-12-22 LAB — LIPID PANEL
Chol/HDL Ratio: 3 ratio (ref 0.0–4.4)
Cholesterol, Total: 194 mg/dL (ref 100–199)
HDL: 64 mg/dL (ref >39)
LDL Chol Calc (NIH): 108 mg/dL — ABNORMAL HIGH (ref 0–99)
Triglycerides: 128 mg/dL (ref 0–149)
VLDL Cholesterol Cal: 22 mg/dL (ref 5–40)

## 2020-12-22 LAB — COMPREHENSIVE METABOLIC PANEL WITH GFR
ALT: 11 [IU]/L (ref 0–32)
AST: 18 [IU]/L (ref 0–40)
Albumin/Globulin Ratio: 1.9 (ref 1.2–2.2)
Albumin: 4.6 g/dL (ref 3.9–5.0)
Alkaline Phosphatase: 71 [IU]/L (ref 44–121)
BUN/Creatinine Ratio: 15 (ref 9–23)
BUN: 11 mg/dL (ref 6–20)
Bilirubin Total: 0.5 mg/dL (ref 0.0–1.2)
CO2: 27 mmol/L (ref 20–29)
Calcium: 9.8 mg/dL (ref 8.7–10.2)
Chloride: 100 mmol/L (ref 96–106)
Creatinine, Ser: 0.74 mg/dL (ref 0.57–1.00)
Globulin, Total: 2.4 g/dL (ref 1.5–4.5)
Glucose: 84 mg/dL (ref 65–99)
Potassium: 4.9 mmol/L (ref 3.5–5.2)
Sodium: 138 mmol/L (ref 134–144)
Total Protein: 7 g/dL (ref 6.0–8.5)
eGFR: 112 mL/min/{1.73_m2}

## 2020-12-22 LAB — HIV ANTIBODY (ROUTINE TESTING W REFLEX): HIV Screen 4th Generation wRfx: NONREACTIVE

## 2020-12-23 LAB — NUSWAB BV AND CANDIDA, NAA
Candida albicans, NAA: NEGATIVE
Candida glabrata, NAA: NEGATIVE

## 2020-12-24 ENCOUNTER — Other Ambulatory Visit: Payer: Self-pay

## 2020-12-24 ENCOUNTER — Ambulatory Visit: Payer: Self-pay | Attending: Family Medicine

## 2020-12-25 LAB — PAP IG AND CT-NG NAA
Chlamydia, Nuc. Acid Amp: NEGATIVE
Gonococcus by Nucleic Acid Amp: NEGATIVE

## 2020-12-26 ENCOUNTER — Ambulatory Visit: Payer: Self-pay

## 2020-12-26 NOTE — Progress Notes (Deleted)
Cardiology Office Note:    Date:  12/26/2020   ID:  Leslie Jackson, DOB 01-Oct-1990, MRN [id number]  PCP:  Kathrynn Speed, NP Referring MD: Kathrynn Speed, NP    [facility]  Cardiologist:  Chilton Si, MD   Reason for visit: ***  History of Present Illness:    Leslie Jackson is a 30 y.o. female with a hx of palpitations.  Echocardiogram in 2018 showed normal LV function.  Holter monitor in 2019 showed occasional PVCs, she was given a prescription for metoprolol as needed.   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  She has a family history of some heart problems although she is uncertain of details.  She was last seen in our office in September 2020 by Corine Shelter, PA-C for squeezing chest pain that began while laying in bed.  It was thought her chest pain was atypical, no w/u was ordered.  She was advised to decrease her caffeine intake.  Vitals at her Internal med visit on 12/21/20: BP 104/70, HR 71. EKG from 10/2019 reviewed: NSR, HR 76 Heart monitor 07/2017 reviewed: NSR, average low HR: 70, average high HR 100.  Occasional PVCs. Labs 12/21/20: normal renal function/LFTs/CBC, Total cholesterol 194, HDL 64, Trig 138, LDL 108.  Chest pain ***  PVCs ***   Past Medical History:  Diagnosis Date   Medical history non-contributory    No pertinent past medical history     Past Surgical History:  Procedure Laterality Date   CESAREAN SECTION  07/12/2011   Procedure: CESAREAN SECTION;  Surgeon: Lazaro Arms, MD;  Location: [facility];  Service: Gynecology;  Laterality: N/A;    Current Medications: No outpatient medications have been marked as taking for the 12/27/20 encounter (Appointment) with Cannon Kettle, PA-C.     Allergies:   Patient has no known allergies.   Social History   Socioeconomic History   Marital status: Married    Spouse name: Not on file   Number of children: Not on file   Years of education:  Not on file   Highest education level: Not on file  Occupational History   Not on file  Tobacco Use   Smoking status: Never   Smokeless tobacco: Never  Vaping Use   Vaping Use: Never used  Substance and Sexual Activity   Alcohol use: No   Drug use: No   Sexual activity: Not Currently    Birth control/protection: None, I.U.D.    Comment: planning for IUD  Other Topics Concern   Not on file  Social History Narrative   Not on file   Social Determinants of Health   Financial Resource Strain: Not on file  Food Insecurity: Not on file  Transportation Needs: Not on file  Physical Activity: Not on file  Stress: Not on file  Social Connections: Not on file     Family History: The patient's family history includes Heart disease in her father; Stroke in her father. There is no history of Anesthesia problems, Hypotension, Malignant hyperthermia, Pseudochol deficiency, Hearing loss, Asthma, Cancer, Diabetes, or Hypertension.  ROS:   Please see the history of present illness.     EKGs/Labs/Other Studies Reviewed:    EKG:  The ekg ordered today demonstrates ***  Recent Labs: 12/21/2020: ALT 11; BUN 11; Creatinine, Ser 0.74; Hemoglobin 13.9; Platelets 188; Potassium 4.9; Sodium 138  Recent Lipid Panel    Component Value Date/Time   CHOL 194 12/21/2020 1003  TRIG 128 12/21/2020 1003   HDL 64 12/21/2020 1003   CHOLHDL 3.0 12/21/2020 1003   LDLCALC 108 (H) 12/21/2020 1003    Physical Exam:    VS:  LMP 12/17/2020     Wt Readings from Last 3 Encounters:  12/21/20 149 lb 0.6 oz (67.6 kg)  11/09/19 145 lb (65.8 kg)  01/17/19 136 lb (61.7 kg)     GEN: *** Well nourished, well developed in no acute distress HEENT: Normal NECK: No JVD; No carotid bruits CARDIAC: ***RRR, no murmurs, rubs, gallops RESPIRATORY:  Clear to auscultation without rales, wheezing or rhonchi  ABDOMEN: Soft, non-tender, non-distended MUSCULOSKELETAL: No edema; No deformity  SKIN: Warm and  dry NEUROLOGIC:  Alert and oriented PSYCHIATRIC:  Normal affect   ASSESSMENT AND PLAN   ***   {Are you ordering a CV Procedure (e.g. stress test, cath, DCCV, TEE, etc)?   Press F2        :[id number]}    Medication Adjustments/Labs and Tests Ordered: Current medicines are reviewed at length with the patient today.  Concerns regarding medicines are outlined above.  No orders of the defined types were placed in this encounter.  No orders of the defined types were placed in this encounter.   There are no Patient Instructions on file for this visit.   Signed, Cannon Kettle, PA-C  12/26/2020 9:18 PM    [facility]

## 2020-12-27 ENCOUNTER — Ambulatory Visit: Payer: Self-pay | Admitting: Physician Assistant

## 2020-12-27 ENCOUNTER — Ambulatory Visit: Payer: Self-pay | Admitting: Nurse Practitioner

## 2020-12-27 NOTE — Progress Notes (Signed)
Cardiology Office Note:    Date:  12/28/2020   ID:  Leslie Jackson, DOB 11-13-1990, MRN [id number]  PCP:  Kathrynn Speed, NP Referring MD: Kathrynn Speed, NP    [facility]  Cardiologist:  Chilton Si, MD   Reason for visit: For overdue 1 year follow-up  History of Present Illness:    Leslie Jackson is a 30 y.o. female with a hx of palpitations.  Echocardiogram in 2018 showed normal LV function.  Holter monitor in 2019 showed occasional PVCs, she was given a prescription for metoprolol as needed.   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  She has a family history of some heart problems although she is uncertain of details.  She was last seen in our office in September 2020 by Corine Shelter, PA-C for squeezing chest pain that began while laying in bed.  It was thought her chest pain was atypical, no w/u was ordered.  She was advised to decrease her caffeine intake.  EKG from 10/2019 reviewed: NSR, HR 76 Heart monitor 07/2017 reviewed: NSR, average low HR: 70, average high HR 100.  Occasional PVCs.  Today, patient states she is doing well overall.  She is off all medications.  She states she has palpitations once a month.  These feel like a stabbing sensation that lasts maybe a second.  No associated triggers.  She states her husband is trying to get her to decrease her caffeine intake.  She mentions she has gained some weight.  She is trying to get back to an exercise routine (starting with walking).  Denies further chest pain.  No shortness of breath, PND, orthopnea, leg edema, bloating, lightheadedness and syncope.  She says her primary care brought up that she should check with cardiology since then the 2 years since her last appointment.  Chest pain, resolved -Reviewed signs and symptoms of coronary artery disease and when to call the office/911. -We reviewed cardiac risk factors.  She is going to focus on improving diet and  exercise.   -May consider a coronary calcium score when she turns 30 yo with her family history of heart disease (father heart issues in his 72s).  No statin or aspirin therapy recommended at this time.  PVCs -Infrequent palpitations.  As her palpitations are so brief (1-2 seconds), do not see need to refill her metoprolol tartrate. -She is considering decreasing her coffee intake  Disposition: Follow-up as needed.  Past Medical History:  Diagnosis Date   Medical history non-contributory    No pertinent past medical history     Past Surgical History:  Procedure Laterality Date   CESAREAN SECTION  07/12/2011   Procedure: CESAREAN SECTION;  Surgeon: Lazaro Arms, MD;  Location: [facility];  Service: Gynecology;  Laterality: N/A;    Current Medications: Current Meds  Medication Sig   EPINEPHrine (EPIPEN 2-PAK) 0.3 mg/0.3 mL IJ SOAJ injection Inject 0.3 mLs (0.3 mg total) into the muscle once as needed for up to 1 dose (for severe allergic reaction). CAll 911 immediately if you have to use this medicine     Allergies:   Patient has no known allergies.   Social History   Socioeconomic History   Marital status: Married    Spouse name: Not on file   Number of children: Not on file   Years of education: Not on file   Highest education level: Not on file  Occupational History   Not on file  Tobacco Use   Smoking status: Never   Smokeless tobacco: Never  Vaping Use   Vaping Use: Never used  Substance and Sexual Activity   Alcohol use: No   Drug use: No   Sexual activity: Not Currently    Birth control/protection: None, I.U.D.    Comment: planning for IUD  Other Topics Concern   Not on file  Social History Narrative   Not on file   Social Determinants of Health   Financial Resource Strain: Not on file  Food Insecurity: Not on file  Transportation Needs: Not on file  Physical Activity: Not on file  Stress: Not on file  Social Connections: Not on file     Family  History: The patient's family history includes Heart disease in her father; Stroke in her father. There is no history of Anesthesia problems, Hypotension, Malignant hyperthermia, Pseudochol deficiency, Hearing loss, Asthma, Cancer, Diabetes, or Hypertension.  ROS:   Please see the history of present illness.     EKGs/Labs/Other Studies Reviewed:    EKG:  The ekg ordered today demonstrates normal sinus rhythm, heart rate 66, PR interval 156 ms, QRS 60 ms, QT interval 422 ms.  Recent Labs: 12/21/2020: ALT 11; BUN 11; Creatinine, Ser 0.74; Hemoglobin 13.9; Platelets 188; Potassium 4.9; Sodium 138  Recent Lipid Panel    Component Value Date/Time   CHOL 194 12/21/2020 1003   TRIG 128 12/21/2020 1003   HDL 64 12/21/2020 1003   CHOLHDL 3.0 12/21/2020 1003   LDLCALC 108 (H) 12/21/2020 1003    Physical Exam:    VS:  BP 106/70   Pulse 66   Resp 20   Ht 5\' 3"  (1.6 m)   Wt 148 lb 6.4 oz (67.3 kg)   LMP 12/17/2020   SpO2 97%   BMI 26.29 kg/m     Wt Readings from Last 3 Encounters:  12/28/20 148 lb 6.4 oz (67.3 kg)  12/21/20 149 lb 0.6 oz (67.6 kg)  11/09/19 145 lb (65.8 kg)     GEN:  Well nourished, well developed in no acute distress HEENT: Normal NECK: No JVD; No carotid bruits CARDIAC: RRR, no murmurs, rubs, gallops RESPIRATORY:  Clear to auscultation without rales, wheezing or rhonchi  ABDOMEN: Soft, non-tender, non-distended MUSCULOSKELETAL: No edema; No deformity  SKIN: Warm and dry NEUROLOGIC:  Alert and oriented PSYCHIATRIC:  Normal affect   ASSESSMENT AND PLAN   Chest pain, resolved -Reviewed signs and symptoms of coronary artery disease and when to call the office/911. -We reviewed cardiac risk factors.  She is going to focus on improving diet and exercise.   -May consider a coronary calcium score when she turns 30 yo with her family history of heart disease (father heart issues in his 66s).  No statin (LDL 108) or aspirin therapy recommended at this  time.  PVCs -Infrequent palpitations.  As her palpitations are so brief (1-2 seconds), do not see need to refill her metoprolol tartrate. -She is considering decreasing her coffee intake  Disposition: Follow-up as needed.      Medication Adjustments/Labs and Tests Ordered: Current medicines are reviewed at length with the patient today.  Concerns regarding medicines are outlined above.  Orders Placed This Encounter  Procedures   EKG 12-Lead   No orders of the defined types were placed in this encounter.   Patient Instructions  Medication Instructions:  Your physician recommends that you continue on your current medications as directed. Please refer to the Current Medication list given to you today.  *  If you need a refill on your cardiac medications before your next appointment, please call your pharmacy*  Lab Work: NONE ordered at this time of appointment   If you have labs (blood work) drawn today and your tests are completely normal, you will receive your results only by: MyChart Message (if you have MyChart) OR A paper copy in the mail If you have any lab test that is abnormal or we need to change your treatment, we will call you to review the results.  Testing/Procedures: NONE ordered at this time of appointment   Follow-Up: At [facility], you and your health needs are our priority.  As part of our continuing mission to provide you with exceptional heart care, we have created designated Provider Care Teams.  These Care Teams include your primary Cardiologist (physician) and Advanced Practice Providers (APPs -  Physician Assistants and Nurse Practitioners) who all work together to provide you with the care you need, when you need it.  We recommend signing up for the patient portal called "MyChart".  Sign up information is provided on this After Visit Summary.  MyChart is used to connect with patients for Virtual Visits (Telemedicine).  Patients are able to view lab/test  results, encounter notes, upcoming appointments, etc.  Non-urgent messages can be sent to your provider as well.   To learn more about what you can do with MyChart, go to [email or web address].    Your next appointment:   As Needed   The format for your next appointment:   In Person  Provider:   Dr. Servando Salina  Other Instructions    Signed, Cannon Kettle, PA-C  12/28/2020 8:47 AM    [facility]

## 2020-12-28 ENCOUNTER — Encounter: Payer: Self-pay | Admitting: Physician Assistant

## 2020-12-28 ENCOUNTER — Ambulatory Visit: Payer: No Typology Code available for payment source | Admitting: Physician Assistant

## 2020-12-28 ENCOUNTER — Other Ambulatory Visit: Payer: Self-pay

## 2020-12-28 ENCOUNTER — Telehealth: Payer: Self-pay

## 2020-12-28 VITALS — BP 106/70 | HR 66 | Resp 20 | Ht 63.0 in | Wt 148.4 lb

## 2020-12-28 DIAGNOSIS — I493 Ventricular premature depolarization: Secondary | ICD-10-CM

## 2020-12-28 DIAGNOSIS — R079 Chest pain, unspecified: Secondary | ICD-10-CM

## 2020-12-28 NOTE — Telephone Encounter (Signed)
Patient would like to stay at the [facility] office due to Dr. Duke Salvia now at [facility]. Would like to switch providers.

## 2020-12-28 NOTE — Patient Instructions (Signed)
Medication Instructions:  Your physician recommends that you continue on your current medications as directed. Please refer to the Current Medication list given to you today.  *If you need a refill on your cardiac medications before your next appointment, please call your pharmacy*  Lab Work: NONE ordered at this time of appointment   If you have labs (blood work) drawn today and your tests are completely normal, you will receive your results only by: MyChart Message (if you have MyChart) OR A paper copy in the mail If you have any lab test that is abnormal or we need to change your treatment, we will call you to review the results.  Testing/Procedures: NONE ordered at this time of appointment   Follow-Up: At [facility], you and your health needs are our priority.  As part of our continuing mission to provide you with exceptional heart care, we have created designated Provider Care Teams.  These Care Teams include your primary Cardiologist (physician) and Advanced Practice Providers (APPs -  Physician Assistants and Nurse Practitioners) who all work together to provide you with the care you need, when you need it.  We recommend signing up for the patient portal called "MyChart".  Sign up information is provided on this After Visit Summary.  MyChart is used to connect with patients for Virtual Visits (Telemedicine).  Patients are able to view lab/test results, encounter notes, upcoming appointments, etc.  Non-urgent messages can be sent to your provider as well.   To learn more about what you can do with MyChart, go to [email or web address].    Your next appointment:   As Needed   The format for your next appointment:   In Person  Provider:   Dr. Servando Salina  Other Instructions

## 2021-01-10 ENCOUNTER — Ambulatory Visit: Payer: Self-pay | Admitting: Nurse Practitioner

## 2021-02-01 ENCOUNTER — Ambulatory Visit (INDEPENDENT_AMBULATORY_CARE_PROVIDER_SITE_OTHER): Payer: Self-pay | Admitting: Nurse Practitioner

## 2021-02-01 ENCOUNTER — Other Ambulatory Visit: Payer: Self-pay

## 2021-02-01 ENCOUNTER — Encounter: Payer: Self-pay | Admitting: Nurse Practitioner

## 2021-02-01 VITALS — BP 106/62 | HR 59 | Temp 98.8°F | Ht 63.0 in | Wt 150.0 lb

## 2021-02-01 DIAGNOSIS — K089 Disorder of teeth and supporting structures, unspecified: Secondary | ICD-10-CM

## 2021-02-01 DIAGNOSIS — G43009 Migraine without aura, not intractable, without status migrainosus: Secondary | ICD-10-CM

## 2021-02-01 DIAGNOSIS — G8929 Other chronic pain: Secondary | ICD-10-CM

## 2021-02-01 NOTE — Progress Notes (Signed)
[facility] Patient [facility] [address] Leslie Jackson [place], [place]  [address] Phone:  [phone]   Fax:  [phone] Subjective:   Patient ID: Leslie Jackson, female    DOB: [birth year], 30 y.o.   MRN: [id number]  Chief Complaint  Patient presents with   Follow-up    6 week follow up;    HPI Anamari Leslie Jackson 30 y.o. female with no significant medical history to the [facility] for reevaluation of HA and referral to dentistry. Patient states that since previous visit, HA's have resolved. Last HA several weeks ago, states that Fioricet was effective in managing HA. Completed follow up with cardiology, states that appointment went well, additional follow up scheduled for 1 yr.  Endorses having broken right lower molar x 1 yr, with intermittent pain. Denies any pain during visit today. States that she noted an abscess on her right lower gum over the weekend, near broken tooth, that ruptured without intervention. Requesting referral to dentistry for repair of the tooth.  Denies any other complaints today. Denies any fever. Denies any fatigue, chest pain, shortness of breath, HA or dizziness. Denies any blurred vision, numbness or tingling.    Past Medical History:  Diagnosis Date   Medical history non-contributory    No pertinent past medical history     Past Surgical History:  Procedure Laterality Date   CESAREAN SECTION  07/12/2011   Procedure: CESAREAN SECTION;  Surgeon: Lazaro Arms, MD;  Location: [facility];  Service: Gynecology;  Laterality: N/A;    Family History  Problem Relation Age of Onset   Heart disease Father    Stroke Father    Anesthesia problems Neg Hx    Hypotension Neg Hx    Malignant hyperthermia Neg Hx    Pseudochol deficiency Neg Hx    Hearing loss Neg Hx    Asthma Neg Hx    Cancer Neg Hx    Diabetes Neg Hx    Hypertension Neg Hx     Social History   Socioeconomic History   Marital status: Married    Spouse name: Not on file    Number of children: Not on file   Years of education: Not on file   Highest education level: Not on file  Occupational History   Not on file  Tobacco Use   Smoking status: Never   Smokeless tobacco: Never  Vaping Use   Vaping Use: Never used  Substance and Sexual Activity   Alcohol use: No   Drug use: No   Sexual activity: Not Currently    Birth control/protection: None, I.U.D.    Comment: planning for IUD  Other Topics Concern   Not on file  Social History Narrative   Not on file   Social Determinants of Health   Financial Resource Strain: Not on file  Food Insecurity: Not on file  Transportation Needs: Not on file  Physical Activity: Not on file  Stress: Not on file  Social Connections: Not on file  Intimate Partner Violence: Not on file    Outpatient Medications Prior to Visit  Medication Sig Dispense Refill   EPINEPHrine (EPIPEN 2-PAK) 0.3 mg/0.3 mL IJ SOAJ injection Inject 0.3 mLs (0.3 mg total) into the muscle once as needed for up to 1 dose (for severe allergic reaction). CAll 911 immediately if you have to use this medicine 1 each 1   No facility-administered medications prior to visit.    No Known Allergies  Review of Systems  Constitutional:  Negative for chills, fever and malaise/fatigue.  HENT:         Dental pain/ damaged tooth  Respiratory:  Negative for cough and shortness of breath.   Cardiovascular:  Negative for chest pain, palpitations and leg swelling.  Gastrointestinal:  Negative for abdominal pain, blood in stool, constipation, diarrhea, nausea and vomiting.  Skin: Negative.   Neurological: Negative.   Psychiatric/Behavioral:  Negative for depression. The patient is not nervous/anxious.   All other systems reviewed and are negative.     Objective:    Physical Exam Constitutional:      General: She is not in acute distress.    Appearance: Normal appearance.  HENT:     Head: Normocephalic.     Nose: Nose normal.     Mouth/Throat:      Mouth: Mucous membranes are moist.     Pharynx: Oropharynx is clear.     Comments: Diffuse dental caries noted. Tooth #31 broken, no inflammation or erythema noted around the tooth. Eyes:     Extraocular Movements: Extraocular movements intact.     Conjunctiva/sclera: Conjunctivae normal.     Pupils: Pupils are equal, round, and reactive to light.  Cardiovascular:     Rate and Rhythm: Normal rate and regular rhythm.     Pulses: Normal pulses.     Heart sounds: Normal heart sounds.     Comments: No obvious peripheral edema Pulmonary:     Effort: Pulmonary effort is normal.     Breath sounds: Normal breath sounds.  Skin:    General: Skin is warm and dry.     Capillary Refill: Capillary refill takes less than 2 seconds.  Neurological:     General: No focal deficit present.     Mental Status: She is alert and oriented to person, place, and time.  Psychiatric:        Mood and Affect: Mood normal.        Behavior: Behavior normal.        Thought Content: Thought content normal.        Judgment: Judgment normal.    BP 106/62   Pulse (!) 59   Temp 98.8 F (37.1 C)   Ht $R'5\' 3"'Tc$  (1.6 m)   Wt 150 lb 0.4 oz (68.1 kg)   LMP 01/21/2021   SpO2 98%   BMI 26.58 kg/m  Wt Readings from Last 3 Encounters:  02/01/21 150 lb 0.4 oz (68.1 kg)  12/28/20 148 lb 6.4 oz (67.3 kg)  12/21/20 149 lb 0.6 oz (67.6 kg)    Immunization History  Administered Date(s) Administered   Tdap 05/10/2015    Diabetic Foot Exam - Simple   No data filed     Lab Results  Component Value Date   TSH 2.350 01/06/2018   Lab Results  Component Value Date   WBC 6.1 12/21/2020   HGB 13.9 12/21/2020   HCT 40.7 12/21/2020   MCV 91 12/21/2020   PLT 188 12/21/2020   Lab Results  Component Value Date   NA 138 12/21/2020   K 4.9 12/21/2020   CO2 27 12/21/2020   GLUCOSE 84 12/21/2020   BUN 11 12/21/2020   CREATININE 0.74 12/21/2020   BILITOT 0.5 12/21/2020   ALKPHOS 71 12/21/2020   AST 18 12/21/2020    ALT 11 12/21/2020   PROT 7.0 12/21/2020   ALBUMIN 4.6 12/21/2020   CALCIUM 9.8 12/21/2020   ANIONGAP 10 03/09/2015   EGFR 112 12/21/2020   Lab Results  Component  Value Date   CHOL 194 12/21/2020   Lab Results  Component Value Date   HDL 64 12/21/2020   Lab Results  Component Value Date   LDLCALC 108 (H) 12/21/2020   Lab Results  Component Value Date   TRIG 128 12/21/2020   Lab Results  Component Value Date   CHOLHDL 3.0 12/21/2020   No results found for: HGBA1C     Assessment & Plan:   Problem List Items Addressed This Visit   None Visit Diagnoses     Chronic dental pain    -  Primary   Relevant Orders   Ambulatory referral to Dentistry Informed to continue taking OTC medications as needed for intermittent pain Discussed monitoring for S/S of infection, such as swelling, redness, increased pain or fever, she should return to the clinic or go straight to the ED if these symptoms occur    Migraine without aura and without status migrainosus, not intractable       Follow up in 1 yr for annual wellness exam, sooner as needed    I am having Leiann K. Ardelle Park maintain her EPINEPHrine.  No orders of the defined types were placed in this encounter.    Teena Dunk, NP

## 2021-02-01 NOTE — Patient Instructions (Signed)
You were seen today in the [facility] for dental pain and follow up for HA. Referral was sent to dentistry. Please follow up in 1 yr for annual wellness exam.

## 2021-07-05 ENCOUNTER — Telehealth: Payer: Self-pay | Admitting: Nurse Practitioner

## 2021-07-05 NOTE — Telephone Encounter (Signed)
Copied from CRM [id number]. Topic: Appointment Scheduling - Scheduling Inquiry for Clinic ?>> Jul 05, 2021  1:12 PM Laural Benes, [place] C wrote: ?Reason for CRM: pt called In to schedule an apt with Mikle Bosworth for her orange card. Pt would like a call back for further assistance. ?

## 2021-07-10 NOTE — Telephone Encounter (Signed)
I return Pt call  she was inform of what she need to do ?

## 2022-01-31 ENCOUNTER — Ambulatory Visit: Payer: No Typology Code available for payment source | Admitting: Nurse Practitioner

## 2022-08-23 ENCOUNTER — Encounter (HOSPITAL_COMMUNITY): Payer: Self-pay | Admitting: Emergency Medicine

## 2022-08-23 ENCOUNTER — Ambulatory Visit (HOSPITAL_COMMUNITY)
Admission: EM | Admit: 2022-08-23 | Discharge: 2022-08-23 | Disposition: A | Discharge status: Home / Self Care | Payer: No Typology Code available for payment source | Attending: Emergency Medicine | Admitting: Emergency Medicine

## 2022-08-23 DIAGNOSIS — K047 Periapical abscess without sinus: Secondary | ICD-10-CM

## 2022-08-23 MED ORDER — AMOXICILLIN-POT CLAVULANATE 875-125 MG PO TABS: 1.0000 | ORAL_TABLET | Freq: Two times a day (BID) | ORAL | 0 refills | Status: DC

## 2022-08-23 NOTE — ED Provider Notes (Signed)
[facility]    CSN: [id number] Arrival date & time: 08/23/22  1613      History   Chief Complaint Chief Complaint  Patient presents with   Oral Swelling    Face swelling, arm feels numb , tingling sensation on right side - Entered by patient    HPI Leslie Jackson is a 32 y.o. female.   Patient reports she has had ongoing broken right molar that has been abscessed, she has been draining it whenever she sees head to it.  Since yesterday, her pain and swelling have been getting worse and now she notices her right lower jaw swelling.  She did take some family members amoxicillin yesterday as well as today.  She denies fevers.  Does not have a dentist.  The history is provided by the patient and medical records.    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 Authorizing Provider  amoxicillin-clavulanate (AUGMENTIN) 875-125 MG tablet Take 1 tablet by mouth every 12 (twelve) hours. 08/23/22  Yes Rinaldo Ratel, [place], FNP  EPINEPHrine (EPIPEN 2-PAK) 0.3 mg/0.3 mL IJ SOAJ injection Inject 0.3  mLs (0.3 mg total) into the muscle once as needed for up to 1 dose (for severe allergic reaction). CAll 911 immediately if you have to use this medicine 11/10/19   Garlon Hatchet, PA-C    Family History Family History  Problem Relation Age of Onset   Heart disease Father    Stroke Father    Anesthesia problems Neg Hx    Hypotension Neg Hx    Malignant hyperthermia Neg Hx    Pseudochol deficiency Neg Hx    Hearing loss Neg Hx    Asthma Neg Hx    Cancer Neg Hx    Diabetes Neg Hx    Hypertension Neg Hx     Social History Social History   Tobacco Use   Smoking status: Never   Smokeless tobacco: Never  Vaping Use   Vaping Use: Never used  Substance Use Topics   Alcohol use: No   Drug use: No     Allergies   Patient has no known allergies.   Review of Systems Review of Systems  Constitutional:  Negative for chills, fatigue and fever.  HENT:  Positive for dental problem. Negative for sore throat.   Respiratory:  Negative for  cough and shortness of breath.   Cardiovascular:  Negative for chest pain.  Gastrointestinal:  Negative for abdominal pain.  Genitourinary:  Negative for dysuria.     Physical Exam Triage Vital Signs ED Triage Vitals  Enc Vitals Group     BP 08/23/22 1639 104/70     Pulse Rate 08/23/22 1639 71     Resp 08/23/22 1639 14     Temp 08/23/22 1639 98.6 F (37 C)     Temp Source 08/23/22 1639 Oral     SpO2 08/23/22 1639 97 %     Weight --      Height --      Head Circumference --      Peak Flow --      Pain Score 08/23/22 1638 6     Pain Loc --      Pain Edu? --      Excl. in GC? --    No data found.  Updated Vital Signs BP 104/70 (BP Location: Left Arm)   Pulse 71   Temp 98.6 F (37 C) (Oral)   Resp 14   LMP 08/11/2022 (Approximate)   SpO2 97%   Visual Acuity Right Eye Distance:   Left Eye Distance:   Bilateral Distance:    Right Eye Near:   Left Eye Near:    Bilateral Near:     Physical Exam Vitals and nursing note  reviewed.  Constitutional:      Appearance: Normal appearance.  HENT:     Head: Normocephalic and atraumatic.     Right Ear: External ear normal.     Left Ear: External ear normal.     Nose: Nose normal.     Mouth/Throat:     Lips: Pink.     Mouth: Mucous membranes are moist.     Dentition: Abnormal dentition. Dental tenderness, dental caries and dental abscesses present.     Tongue: No lesions.     Pharynx: Uvula midline.     Tonsils: No tonsillar exudate or tonsillar abscesses.      Comments: Right second molar with diffuse breakdown and dental caries.  Gingival swelling and erythema surrounding this tooth. Eyes:     General: No scleral icterus.    Conjunctiva/sclera: Conjunctivae normal.  Cardiovascular:     Rate and Rhythm: Normal rate and regular rhythm.     Heart sounds: Normal heart sounds. No murmur heard. Pulmonary:     Effort: Pulmonary effort is normal. No respiratory distress.     Breath sounds: Normal breath sounds.  Musculoskeletal:     Cervical back: Normal range of motion.  Lymphadenopathy:     Cervical: Cervical adenopathy present.  Skin:    General: Skin is warm and dry.  Neurological:     General: No focal deficit present.     Mental Status: She is alert and oriented to person, place, and time.  Psychiatric:        Mood and Affect: Mood normal.        Behavior: Behavior normal. Behavior is cooperative.      UC Treatments / Results  Labs (all labs ordered are listed, but only abnormal results are displayed) Labs Reviewed - No data to display  EKG   Radiology No results found.  Procedures Procedures (including critical care time)  Medications Ordered in UC Medications - No data to display  Initial Impression / Assessment and Plan / UC Course  I have reviewed the triage vital signs and the nursing notes.  Pertinent labs & imaging results that were available during my care of the patient were reviewed by me and considered in my medical  decision making (see chart for details).  Vitals and triage reviewed, patient is hemodynamically stable.  Open broken molar that has been having a recurrent abscess, will treat with Augmentin.  Advised ibuprofen and dental follow-up.  Return and follow-up precautions reviewed, no questions at this time.      Final Clinical Impressions(s) / UC Diagnoses   Final diagnoses:  Dental abscess     Discharge Instructions      Please take Augmentin twice daily until finished, you can take it with food to prevent gastrointestinal upset.  Please stop taking the amoxicillin.  You can take ibuprofen 600 to 800 mg every 8 hours with food as needed for pain and swelling.  Your swelling and pain should improve over the next 3 days.   Please seek immediate care if you develop increase in swelling over the next 3 days, or unable to swallow, have high fever despite medication.  Below and attached to the back of this paper some dental resources to reach out to you.  The antibiotics will not fix your broken tooth.  You have to follow-up with a dentist.  [facility] in [place], [place] Address: [address] [place], [place], [place] [address] Phone: [phone]  [facility] [phone] extension [phone] [address]  Dr. Lawrence Marseilles [phone] [address]  [facility] [phone] [address] [facility] [place].  [facility] [phone] extension [phone] [place], [place], [address] First come first serve for the first 10 clients.  May do simple extractions only, no wisdom teeth or surgery.  You may try the second for Thursday of the month starting at 6:30 AM.  [facility] You may call the school to see if they are still helping to provide dental care for emergent cases.       ED Prescriptions     Medication Sig Dispense Auth. Provider   amoxicillin-clavulanate (AUGMENTIN) 875-125 MG tablet Take 1 tablet by mouth every 12  (twelve) hours. 14 tablet Zamiyah Resendes, [place], [place]      PDMP not reviewed this encounter.   Liah Morr, [place] N, [place] 08/23/22 [phone]

## 2022-08-23 NOTE — Discharge Instructions (Addendum)
Please take Augmentin twice daily until finished, you can take it with food to prevent gastrointestinal upset.  Please stop taking the amoxicillin.  You can take ibuprofen 600 to 800 mg every 8 hours with food as needed for pain and swelling.  Your swelling and pain should improve over the next 3 days.   Please seek immediate care if you develop increase in swelling over the next 3 days, or unable to swallow, have high fever despite medication.  Below and attached to the back of this paper some dental resources to reach out to you.  The antibiotics will not fix your broken tooth.  You have to follow-up with a dentist.  [facility] in [place], [place] Address: [address] [place], [place], [place] [address] Phone: [phone]  [facility] [phone] extension [phone] [address]  Dr. Lawrence Marseilles [phone] [address]  Rafael Gonzalez [phone] [address] [facility] [place].  [facility] [phone] extension [phone], [place], [place], [address] First come first serve for the first 10 clients.  May do simple extractions only, no wisdom teeth or surgery.  You may try the second for Thursday of the month starting at 6:30 AM.  [facility] You may call the school to see if they are still helping to provide dental care for emergent cases.

## 2022-08-23 NOTE — ED Triage Notes (Signed)
Pt reports dental infection for 3 weeks to a month. Reports yesterday pain and swelling got worse. Took Amoxicillin yesterday and today that was given by her mother-in-law

## 2023-06-14 ENCOUNTER — Inpatient Hospital Stay (HOSPITAL_COMMUNITY)
Admission: AD | Admit: 2023-06-14 | Discharge: 2023-06-14 | Disposition: A | Discharge status: Home / Self Care | Payer: No Typology Code available for payment source | Attending: Obstetrics & Gynecology | Admitting: Obstetrics & Gynecology

## 2023-06-14 ENCOUNTER — Other Ambulatory Visit: Payer: Self-pay

## 2023-06-14 ENCOUNTER — Inpatient Hospital Stay (HOSPITAL_COMMUNITY): Payer: No Typology Code available for payment source

## 2023-06-14 ENCOUNTER — Encounter (HOSPITAL_COMMUNITY): Payer: Self-pay

## 2023-06-14 DIAGNOSIS — O26899 Other specified pregnancy related conditions, unspecified trimester: Secondary | ICD-10-CM

## 2023-06-14 DIAGNOSIS — O26891 Other specified pregnancy related conditions, first trimester: Secondary | ICD-10-CM | POA: Insufficient documentation

## 2023-06-14 DIAGNOSIS — Z3A10 10 weeks gestation of pregnancy: Secondary | ICD-10-CM | POA: Insufficient documentation

## 2023-06-14 DIAGNOSIS — O209 Hemorrhage in early pregnancy, unspecified: Secondary | ICD-10-CM | POA: Insufficient documentation

## 2023-06-14 LAB — URINALYSIS, ROUTINE W REFLEX MICROSCOPIC
Bilirubin Urine: NEGATIVE
Glucose, UA: NEGATIVE mg/dL
Hgb urine dipstick: NEGATIVE
Ketones, ur: NEGATIVE mg/dL
Leukocytes,Ua: NEGATIVE
Nitrite: NEGATIVE
Protein, ur: NEGATIVE mg/dL
Specific Gravity, Urine: 1.005 (ref 1.005–1.030)
pH: 7 (ref 5.0–8.0)

## 2023-06-14 LAB — COMPREHENSIVE METABOLIC PANEL
ALT: 13 U/L (ref 0–44)
AST: 17 U/L (ref 15–41)
Albumin: 3.9 g/dL (ref 3.5–5.0)
Alkaline Phosphatase: 52 U/L (ref 38–126)
Anion gap: 10 (ref 5–15)
BUN: 12 mg/dL (ref 6–20)
CO2: 23 mmol/L (ref 22–32)
Calcium: 9.3 mg/dL (ref 8.9–10.3)
Chloride: 104 mmol/L (ref 98–111)
Creatinine, Ser: 0.68 mg/dL (ref 0.44–1.00)
GFR, Estimated: >60 mL/min (ref >60)
Glucose, Bld: 113 mg/dL — ABNORMAL HIGH (ref 70–99)
Potassium: 3.7 mmol/L (ref 3.5–5.1)
Sodium: 137 mmol/L (ref 135–145)
Total Bilirubin: 0.6 mg/dL (ref 0.0–1.2)
Total Protein: 6.5 g/dL (ref 6.5–8.1)

## 2023-06-14 LAB — CBC
HCT: 38 % (ref 36.0–46.0)
Hemoglobin: 12.8 g/dL (ref 12.0–15.0)
MCH: 31.1 pg (ref 26.0–34.0)
MCHC: 33.7 g/dL (ref 30.0–36.0)
MCV: 92.5 fL (ref 80.0–100.0)
Platelets: 181 10*3/uL (ref 150–400)
RBC: 4.11 MIL/uL (ref 3.87–5.11)
RDW: 12.7 % (ref 11.5–15.5)
WBC: 9.5 10*3/uL (ref 4.0–10.5)
nRBC: 0 % (ref 0.0–0.2)

## 2023-06-14 LAB — WET PREP, GENITAL
Clue Cells Wet Prep HPF POC: NONE SEEN
Sperm: NONE SEEN
Trich, Wet Prep: NONE SEEN
WBC, Wet Prep HPF POC: >=10 — AB (ref <10)
Yeast Wet Prep HPF POC: NONE SEEN

## 2023-06-14 LAB — HCG, QUANTITATIVE, PREGNANCY: hCG, Beta Chain, Quant, S: 2129 m[IU]/mL — ABNORMAL HIGH (ref <5)

## 2023-06-14 LAB — POCT PREGNANCY, URINE: Preg Test, Ur: POSITIVE — AB

## 2023-06-14 NOTE — MAU Provider Note (Signed)
History     CSN: [id number]  Arrival date and time: 06/14/23 1643   Event Date/Time   First Provider Initiated Contact with Patient 06/14/2023  5:16 PM   Chief Complaint  Patient presents with   Abdominal Pain   Vaginal Bleeding    HPI  Leslie Jackson is a 33 y.o. G3P2002 at [redacted]w[redacted]d who presents to the MAU for vaginal bleeding and cramping in early pregnancy. Reports LMP 04/03/23 -- certain dating. She had positive preg test a couple weeks ago. Reports four days of light vaginal bleeding -- initially started as pink discharge w wiping, but bleeding has become more bright red, but still scant. She denies any recent intercourse. Reports pelvic pain and some vaginal pain. Rates pain as 5-6/10 and is constant. Has not tried any meds for pain. She was told by a previous provider that she had a "low uterus" and needed to be watched closely in pregnancy. She also reports hx PVCs, for which she was told she would be a high risk pregnancy. No fevers, chills, n/v, urinary sxs. Endorses constipation.  Past Medical History:  Diagnosis Date   Medical history non-contributory    No pertinent past medical history     Past Surgical History:  Procedure Laterality Date   CESAREAN SECTION  07/12/2011   Procedure: CESAREAN SECTION;  Surgeon: Lazaro Arms, MD;  Location: [facility];  Service: Gynecology;  Laterality: N/A;    Family History  Problem Relation Age of Onset   Heart disease Father    Stroke Father    Anesthesia problems Neg Hx    Hypotension Neg Hx    Malignant hyperthermia Neg Hx    Pseudochol deficiency Neg Hx    Hearing loss Neg Hx    Asthma Neg Hx    Cancer Neg Hx    Diabetes Neg Hx    Hypertension Neg Hx     Social History   Tobacco Use   Smoking status: Never   Smokeless tobacco: Never  Vaping Use   Vaping status: Never Used  Substance Use Topics   Alcohol use: No   Drug use: No    Allergies: No Known Allergies  No medications prior to admission.     ROS reviewed and pertinent positives and negatives as documented in HPI.  Physical Exam   Blood pressure 121/80, pulse 80, temperature 98.5 F (36.9 C), resp. rate 16, height 5\' 4"  (1.626 m), weight 67.8 kg, last menstrual period 04/03/2023, SpO2 100%.  Physical Exam Constitutional:      General: She is not in acute distress.    Appearance: Normal appearance. She is not ill-appearing.  HENT:     Head: Normocephalic and atraumatic.  Cardiovascular:     Rate and Rhythm: Normal rate.  Pulmonary:     Effort: Pulmonary effort is normal.     Breath sounds: Normal breath sounds.  Abdominal:     Palpations: Abdomen is soft.     Tenderness: There is no abdominal tenderness. There is no guarding.  Musculoskeletal:        General: Normal range of motion.  Skin:    General: Skin is warm and dry.     Findings: No rash.  Neurological:     General: No focal deficit present.     Mental Status: She is alert and oriented to person, place, and time.     MAU Course  Procedures  MDM 33 y.o. G3P2002 at [redacted]w[redacted]d presenting for VB in setting of early pregnancy.  She is well appearing with a benign abdominal exam. On labs, she is Rh positive, HgB ok, and hCG 2129. UA and swabs negative. U/S showing IUGS, but no FP or YS. Discussed results in detail w pt. Recommend viability U/S in 7-10 days, which was scheduled. Discussed pelvic rest in the interim w pt. Message sent to [facility] to coordinate new OB visit, U/S has been scheduled. Discussed w pt that if bleeding worsened or any new symptoms arose, to return for further evaluation. Stable for d/c.  Assessment and Plan  Vaginal bleeding affecting early pregnancy - Plan: Discharge patient  Abdominal pain affecting pregnancy - Plan: Discharge patient Unclear etiology -- swabs, labs, and U/S all reassuring Needs repeat U/S for viability in 7-10 days Discussed use of Tylenol, heat for pain Rec return to MAU if bleeding worsens  Sundra Aland, MD OB  Fellow, Faculty Practice [facility], Center for [facility]  06/14/2023, 9:29 PM

## 2023-06-14 NOTE — Discharge Instructions (Signed)
 Safe Medications in Pregnancy   Acne: Benzoyl Peroxide Salicylic Acid  Backache/Headache: Tylenol: 2 regular strength every 4 hours OR              2 Extra strength every 6 hours  Colds/Coughs/Allergies: Benadryl (alcohol free) 25 mg every 6 hours as needed Breath right strips Claritin Cepacol throat lozenges Chloraseptic throat spray Cold-Eeze- up to three times per day Cough drops, alcohol free Flonase (by prescription only) Guaifenesin Mucinex Robitussin DM (plain only, alcohol free) Saline nasal spray/drops Sudafed (pseudoephedrine) & Actifed ** use only after [redacted] weeks gestation and if you do not have high blood pressure Tylenol Vicks Vaporub Zinc lozenges Zyrtec   Constipation: Colace Ducolax suppositories Fleet enema Glycerin suppositories Metamucil Milk of magnesia Miralax Senokot Smooth move tea  Diarrhea: Kaopectate Imodium A-D  *NO pepto Bismol  Hemorrhoids: Anusol Anusol HC Preparation H Tucks  Indigestion: Tums Maalox Mylanta Cimetidine (Tagamet HB)** preferred in pregnancy Famotidine (Pepcid) Ranitidine (Zantac)  Insomnia: Benadryl (alcohol free) 25mg  every 6 hours as needed Tylenol PM Unisom, no Gelcaps  Leg Cramps: Tums MagGel  Nausea/Vomiting:  Bonine Dramamine Emetrol Ginger extract Sea bands Meclizine   Nausea medication to take during pregnancy:  Unisom (doxylamine succinate 25 mg tablets) Take one tablet daily at bedtime. If symptoms are not adequately controlled, the dose can be increased to a maximum recommended dose of two tablets daily (1/2 tablet in the morning, 1/2 tablet mid-afternoon and one at bedtime). Vitamin B6 100mg  tablets. Take one tablet twice a day (up to 200 mg per day).  Skin Rashes: Aveeno products Benadryl cream or 25mg  every 6 hours as needed Calamine Lotion 1% cortisone cream  Yeast infection: Gyne-lotrimin 7 Monistat 7   **If taking multiple medications, please check labels to avoid  duplicating the same active ingredients **take medication as directed on the label ** Do not exceed 4000 mg of tylenol in 24 hours **Do not take medications that contain aspirin or ibuprofen

## 2023-06-14 NOTE — MAU Note (Signed)
.  Leslie Jackson is a 33 y.o. at [redacted]w[redacted]d here in MAU reporting: light vag bleeding for the past 4 days. Pt states bleeding is bright red.  Pt also reports some lower abd cramping.     Onset of complaint: 4 days Pain score: 6 Vitals:   06/14/23 1714  BP: 120/63  Pulse: 62  Resp: 16  Temp: 98.2 F (36.8 C)  SpO2: 100%      Lab orders placed from triage: ua

## 2023-06-15 LAB — GC/CHLAMYDIA PROBE AMP (~~LOC~~) NOT AT ARMC
Chlamydia: NEGATIVE
Comment: NEGATIVE
Comment: NORMAL
Neisseria Gonorrhea: NEGATIVE

## 2023-06-22 ENCOUNTER — Ambulatory Visit: Payer: No Typology Code available for payment source

## 2023-06-22 ENCOUNTER — Other Ambulatory Visit: Payer: Self-pay

## 2023-06-22 ENCOUNTER — Telehealth: Payer: Self-pay | Admitting: *Deleted

## 2023-06-22 DIAGNOSIS — O3680X0 Pregnancy with inconclusive fetal viability, not applicable or unspecified: Secondary | ICD-10-CM

## 2023-06-22 DIAGNOSIS — Z3A00 Weeks of gestation of pregnancy not specified: Secondary | ICD-10-CM

## 2023-06-22 NOTE — Telephone Encounter (Signed)
 Dr. Donavan Foil called patient and she chooses expectant management. Dr. Donavan Foil requested follow up with provider 2-3 weeks. Message sent to front desk to schedule. Nancy Fetter

## 2023-06-22 NOTE — Telephone Encounter (Signed)
 Sonographer informed me this afternoon that patient had another abnormal [place] today in our office. Today US showed gestational sac measuring around 5 weeks, No fetal pole, no yolk sac. Patient was seen this am and left office this am. I informed sonographer I will notify provider to review [place] and contact patient with results and plan of care.Previous US on 06/14/23 showed single gestational sac, no yolk sac, no embryo MSD [redacted]w[redacted]d. I discussed [place] results today and from 06/14/23 with Dr. Donavan Foil. He will review and contact patient later today as he is in clinic.  Nancy Fetter

## 2023-07-07 ENCOUNTER — Encounter: Payer: Self-pay | Admitting: Obstetrics and Gynecology

## 2023-07-07 ENCOUNTER — Ambulatory Visit (INDEPENDENT_AMBULATORY_CARE_PROVIDER_SITE_OTHER): Admitting: Obstetrics and Gynecology

## 2023-07-07 ENCOUNTER — Other Ambulatory Visit: Payer: Self-pay

## 2023-07-07 VITALS — BP 111/71 | HR 76 | Wt 146.0 lb

## 2023-07-07 DIAGNOSIS — O039 Complete or unspecified spontaneous abortion without complication: Secondary | ICD-10-CM

## 2023-07-07 DIAGNOSIS — Z3A01 Less than 8 weeks gestation of pregnancy: Secondary | ICD-10-CM

## 2023-07-07 NOTE — Progress Notes (Signed)
   GYNECOLOGY PROGRESS NOTE  History:  33 y.o. [id number] presents to [facility] for SAB follow up. Had bleeding for 3 weeks and it has since stopped. She reports the bleeding was like a period, some spotting and cramping with the bleeding. Unsure if she passed tissue. Denies bleeding or cramping currently   Health Maintenance Due  Topic Date Due   Hepatitis C Screening  Never done   INFLUENZA VACCINE  Never done   COVID-19 Vaccine (1 - 2024-25 season) Never done     Review of Systems:  Pertinent items are noted in HPI.   Objective:  Physical Exam Last menstrual period 04/03/2023. VS reviewed, nursing note reviewed,  Constitutional: well developed, well nourished, no distress HEENT: normocephalic Pulm/chest wall: normal effort Abdomen: soft Neuro: alert and oriented  Psych: affect normal Pelvic exam: deferred  Assessment & Plan:  1. Spontaneous miscarriage (Primary) 2/16 IUP no yolk sac or empbyo visualized  meausring 5w 1 d  2/24 u/s  IUP yolk sac and fetal pole not visualized [redacted]w[redacted]d- at this time she opted for expectant management  Discussed follow up u/s to determine if any follow up needed Offered counseling services  Reassured patient that there is nothing she did or did not do to cause this. Reviewed most common reason is presumed to be genetic abnormalities that allow a pregnancy to start but not continue past an early stage, but realistically we do not know the cause in most cases. Reviewed that studies show no definite difference between attempting another pregnancy sooner vs waiting  - US OB Transvaginal; Future Leslie Grates, FNP

## 2023-07-08 ENCOUNTER — Ambulatory Visit (INDEPENDENT_AMBULATORY_CARE_PROVIDER_SITE_OTHER)

## 2023-07-08 ENCOUNTER — Encounter: Payer: Self-pay | Admitting: Certified Nurse Midwife

## 2023-07-08 ENCOUNTER — Ambulatory Visit: Admitting: Certified Nurse Midwife

## 2023-07-08 DIAGNOSIS — O039 Complete or unspecified spontaneous abortion without complication: Secondary | ICD-10-CM

## 2023-07-08 DIAGNOSIS — Z3A01 Less than 8 weeks gestation of pregnancy: Secondary | ICD-10-CM

## 2023-07-08 DIAGNOSIS — Z3A10 10 weeks gestation of pregnancy: Secondary | ICD-10-CM

## 2023-07-08 MED ORDER — OXYCODONE HCL 5 MG PO TABS: 5.0000 mg | ORAL_TABLET | ORAL | 0 refills | Status: DC | PRN

## 2023-07-08 MED ORDER — ONDANSETRON 4 MG PO TBDP: 4.0000 mg | ORAL_TABLET | Freq: Three times a day (TID) | ORAL | 0 refills | Status: DC | PRN

## 2023-07-08 MED ORDER — IBUPROFEN 600 MG PO TABS: 600.0000 mg | ORAL_TABLET | Freq: Four times a day (QID) | ORAL | 3 refills | Status: DC | PRN

## 2023-07-08 MED ORDER — MISOPROSTOL 200 MCG PO TABS: ORAL_TABLET | ORAL | 1 refills | Status: DC

## 2023-07-08 NOTE — Progress Notes (Signed)
 History:  [name] is a 33 y.o. [id number] who presents to clinic today for viability scan, had some spotting for three weeks (since she found out she was pregnant) but none today. Still having some mild diffuse lower abdominal cramping, 2/10 right now. No other physical complaints.   The following portions of the patient's history were reviewed and updated as appropriate: allergies, current medications, family history, past medical history, social history, past surgical history and problem list.  Review of Systems:  Pertinent items noted in HPI and remainder of comprehensive ROS otherwise negative.   Objective:  Physical Exam LMP 04/03/2023  Physical Exam Vitals and nursing note reviewed.  Constitutional:      Appearance: Normal appearance.  Cardiovascular:     Rate and Rhythm: Normal rate and regular rhythm.  Pulmonary:     Effort: Pulmonary effort is normal.  Musculoskeletal:        General: Normal range of motion.  Skin:    General: Skin is warm and dry.     Capillary Refill: Capillary refill takes less than 2 seconds.  Neurological:     Mental Status: She is alert and oriented to person, place, and time.  Psychiatric:        Mood and Affect: Mood normal.        Behavior: Behavior normal.    Labs and Imaging Most recent CBC normal (Hgb 12.8)  Ultrasound today  noted same gestational sac measuring [redacted]w[redacted]d without YS or FP. Definitive for failed pregnancy.   Assessment & Plan:  1. Miscarriage (Primary) - Reviewed results with patient and answered questions.  - Reviewed options including expectant management, cytotec therapy or D&C. - Pt opted to take prescriptions but wants to see if her body begins the process on its own.  - Prescriptions sent and follow up appt made for 3 weeks. Warning signs given. - ondansetron (ZOFRAN-ODT) 4 MG disintegrating tablet; Take 1 tablet (4 mg total) by mouth every 8 (eight) hours as needed for nausea or vomiting.  Dispense:  5 tablet; Refill: 0 - misoprostol (CYTOTEC) 200 MCG tablet; Place four tablets in between your gums and cheeks (two tablets on each side) as instructed  Dispense: 4 tablet; Refill: 1 - oxyCODONE (ROXICODONE) 5 MG immediate release tablet; Take 1 tablet (5 mg total) by mouth every 4 (four) hours as needed for severe pain (pain score 7-10).  Dispense: 6 tablet; Refill: 0 - ibuprofen (ADVIL) 600 MG tablet; Take 1 tablet (600 mg total) by mouth every 6 (six) hours as needed.  Dispense: 60 tablet; Refill: 3  Follow up in 3 weeks or PRN.  Future Appointments  Date Time Provider Department Center  07/29/2023  1:15 PM Bernerd Limbo, CNM [facility]   Bernerd Limbo, [place] 07/08/2023 5:53 PM

## 2023-07-21 ENCOUNTER — Telehealth: Payer: No Typology Code available for payment source

## 2023-07-28 ENCOUNTER — Encounter: Payer: Self-pay | Admitting: Certified Nurse Midwife

## 2023-07-29 ENCOUNTER — Encounter: Payer: Self-pay | Admitting: Certified Nurse Midwife

## 2023-07-29 ENCOUNTER — Other Ambulatory Visit: Payer: Self-pay

## 2023-07-29 ENCOUNTER — Ambulatory Visit (INDEPENDENT_AMBULATORY_CARE_PROVIDER_SITE_OTHER): Payer: Self-pay

## 2023-07-29 ENCOUNTER — Ambulatory Visit: Payer: Self-pay | Admitting: Certified Nurse Midwife

## 2023-07-29 VITALS — BP 104/67 | HR 79 | Wt 149.1 lb

## 2023-07-29 DIAGNOSIS — Z3A01 Less than 8 weeks gestation of pregnancy: Secondary | ICD-10-CM

## 2023-07-29 DIAGNOSIS — O034 Incomplete spontaneous abortion without complication: Secondary | ICD-10-CM

## 2023-07-29 DIAGNOSIS — N939 Abnormal uterine and vaginal bleeding, unspecified: Secondary | ICD-10-CM | POA: Insufficient documentation

## 2023-07-29 DIAGNOSIS — Z3A00 Weeks of gestation of pregnancy not specified: Secondary | ICD-10-CM

## 2023-07-29 NOTE — Progress Notes (Cosign Needed Addendum)
 History:  Ms. Nohelia Marni Griffon is a 33 y.o. [id number] who presents to clinic today to follow up after taking misoprostol for a MAB. LMP = 04/03/23, was seen for 3 weeks of spotting on 2/16. Only a gestational sac was found at that appt. Was seen for viability scan on 3/12 and a MAB was confirmed. She was given options and wanted to take the prescriptions home and think about her options. Ended up taking miso on 3/18 and notes "it wasn't bad." She had a few days of mild cramping, one small clot but has otherwise been moderately bleeding ever since. Had a gush of blood two days ago that soaked a pad, but normally needs to change it 5-6x/day. Desires to get pregnant again. No other physical complaints.  The following portions of the patient's history were reviewed and updated as appropriate: allergies, current medications, family history, past medical history, social history, past surgical history and problem list.  Review of Systems:  Pertinent items noted in HPI and remainder of comprehensive ROS otherwise negative.   Objective:  Physical Exam BP 104/67   Pulse 79   Wt 149 lb 1.6 oz (67.6 kg)   LMP 04/03/2023   Breastfeeding No   BMI 25.59 kg/m  Physical Exam Vitals and nursing note reviewed.  Constitutional:      General: She is not in acute distress.    Appearance: Normal appearance. She is not ill-appearing.  Cardiovascular:     Rate and Rhythm: Normal rate and regular rhythm.  Pulmonary:     Effort: Pulmonary effort is normal.  Abdominal:     Palpations: Abdomen is soft.     Tenderness: There is no abdominal tenderness.  Musculoskeletal:        General: Normal range of motion.  Skin:    General: Skin is warm and dry.     Capillary Refill: Capillary refill takes less than 2 seconds.  Neurological:     Mental Status: She is alert and oriented to person, place, and time.  Psychiatric:        Mood and Affect: Mood normal.        Behavior: Behavior normal.    Labs and  Imaging Last Hgb on 2/16: 12.8 O pos   Assessment & Plan:  1. Incomplete miscarriage (Primary) - Sent for u/s at 3:15pm. - Reviewed images with Dr. Nobie Putnam, pt noted to still have large gestational sac present.  - Given failure of miso and continued presence of IUGS, warrants D&E.  - Pt amenable to plan, D&E request sent to Donne Hazel for scheduling assistance - US OB Transvaginal   Follow up 2 weeks after surgery with CNM.  Bernerd Limbo, [place] 07/29/2023 4:10 PM

## 2023-07-30 ENCOUNTER — Encounter (HOSPITAL_COMMUNITY): Payer: Self-pay | Admitting: Obstetrics and Gynecology

## 2023-07-30 ENCOUNTER — Other Ambulatory Visit: Payer: Self-pay | Admitting: Obstetrics and Gynecology

## 2023-07-30 ENCOUNTER — Telehealth: Payer: Self-pay

## 2023-07-30 NOTE — Progress Notes (Signed)
 Spoke w/ via phone for pre-op interview--- [employer]---- None per anesthesia. Surgeon orders requested 07/30/23.        Lab results------ COVID test -----patient states asymptomatic no test needed Arrive at -------0800 NPO after MN NO Solid Food. Pre-Surgery Ensure or G2:  Med rec completed Medications to take morning of surgery ----- NONE Diabetic medication -----  GLP1 agonist last dose: GLP1 instructions:  Patient instructed no nail polish to be worn day of surgery Patient instructed to bring photo id and insurance card day of surgery Patient aware to have Driver (ride ) / caregiver    for 24 hours after surgery - Husband Rudi Rummage Patient Special Instructions ----- Shower with antibacterial soap. Pre-Op special Instructions -----  Patient verbalized understanding of instructions that were given at this phone interview. Patient denies chest pain, sob, fever, cough at the interview.

## 2023-07-30 NOTE — Telephone Encounter (Signed)
 Called patient to provide emergency surgery details for 07/31/23. Patient is scheduled at [facility] at 10 am and must arrive by 8 am. I provided pre-op instructions and surgery details by phone and patient confirmed understanding.Written details will be sent to patient's Mychart acct.

## 2023-07-31 ENCOUNTER — Ambulatory Visit (HOSPITAL_COMMUNITY): Admitting: Anesthesiology

## 2023-07-31 ENCOUNTER — Ambulatory Visit (HOSPITAL_BASED_OUTPATIENT_CLINIC_OR_DEPARTMENT_OTHER): Admitting: Anesthesiology

## 2023-07-31 ENCOUNTER — Other Ambulatory Visit: Payer: Self-pay

## 2023-07-31 ENCOUNTER — Ambulatory Visit (HOSPITAL_COMMUNITY)
Admission: RE | Admit: 2023-07-31 | Discharge: 2023-07-31 | Disposition: A | Discharge status: Home / Self Care | Source: Ambulatory Visit | Attending: Obstetrics and Gynecology | Admitting: Obstetrics and Gynecology

## 2023-07-31 ENCOUNTER — Encounter (HOSPITAL_COMMUNITY): Payer: Self-pay | Admitting: Obstetrics and Gynecology

## 2023-07-31 ENCOUNTER — Encounter (HOSPITAL_COMMUNITY)
Admission: RE | Disposition: A | Discharge status: Home / Self Care | Payer: Self-pay | Source: Ambulatory Visit | Attending: Obstetrics and Gynecology

## 2023-07-31 DIAGNOSIS — O021 Missed abortion: Secondary | ICD-10-CM | POA: Insufficient documentation

## 2023-07-31 DIAGNOSIS — Z9889 Other specified postprocedural states: Secondary | ICD-10-CM

## 2023-07-31 DIAGNOSIS — O081 Delayed or excessive hemorrhage following ectopic and molar pregnancy: Secondary | ICD-10-CM | POA: Insufficient documentation

## 2023-07-31 DIAGNOSIS — N812 Incomplete uterovaginal prolapse: Secondary | ICD-10-CM | POA: Diagnosis present

## 2023-07-31 DIAGNOSIS — Z3A01 Less than 8 weeks gestation of pregnancy: Secondary | ICD-10-CM | POA: Insufficient documentation

## 2023-07-31 HISTORY — PX: DILATION AND EVACUATION: SHX1459

## 2023-07-31 SURGERY — DILATION AND EVACUATION, UTERUS
Anesthesia: General | Site: Uterus

## 2023-07-31 MED ORDER — KETOROLAC TROMETHAMINE 30 MG/ML IJ SOLN
INTRAMUSCULAR | Status: AC
  Filled 2023-07-31: qty 1

## 2023-07-31 MED ORDER — ONDANSETRON HCL 4 MG/2ML IJ SOLN
INTRAMUSCULAR | Status: DC | PRN
  Administered 2023-07-31: 4 mg via INTRAVENOUS

## 2023-07-31 MED ORDER — DOXYCYCLINE HYCLATE 100 MG IV SOLR
200.0000 mg | Freq: Once | INTRAVENOUS | Status: AC
  Administered 2023-07-31: 200 mg via INTRAVENOUS
  Filled 2023-07-31: qty 200

## 2023-07-31 MED ORDER — SCOPOLAMINE 1 MG/3DAYS TD PT72
MEDICATED_PATCH | TRANSDERMAL | Status: AC
  Filled 2023-07-31: qty 1

## 2023-07-31 MED ORDER — LIDOCAINE HCL 1 % IJ SOLN
INTRAMUSCULAR | Status: DC | PRN
  Administered 2023-07-31: 20 mL

## 2023-07-31 MED ORDER — PROPOFOL 10 MG/ML IV BOLUS
INTRAVENOUS | Status: DC | PRN
  Administered 2023-07-31: 150 mg via INTRAVENOUS

## 2023-07-31 MED ORDER — FENTANYL CITRATE (PF) 250 MCG/5ML IJ SOLN
INTRAMUSCULAR | Status: AC
  Filled 2023-07-31: qty 5

## 2023-07-31 MED ORDER — ONDANSETRON HCL 4 MG/2ML IJ SOLN
INTRAMUSCULAR | Status: AC
  Filled 2023-07-31: qty 2

## 2023-07-31 MED ORDER — KETOROLAC TROMETHAMINE 30 MG/ML IJ SOLN
INTRAMUSCULAR | Status: DC | PRN
  Administered 2023-07-31: 30 mg via INTRAVENOUS

## 2023-07-31 MED ORDER — 0.9 % SODIUM CHLORIDE (POUR BTL) OPTIME
TOPICAL | Status: DC | PRN
  Administered 2023-07-31: 1000 mL

## 2023-07-31 MED ORDER — PHENYLEPHRINE 80 MCG/ML (10ML) SYRINGE FOR IV PUSH (FOR BLOOD PRESSURE SUPPORT)
PREFILLED_SYRINGE | INTRAVENOUS | Status: DC | PRN
  Administered 2023-07-31 (×4): 80 ug via INTRAVENOUS

## 2023-07-31 MED ORDER — MIDAZOLAM HCL 2 MG/2ML IJ SOLN
INTRAMUSCULAR | Status: DC | PRN
  Administered 2023-07-31: 2 mg via INTRAVENOUS

## 2023-07-31 MED ORDER — LIDOCAINE 2% (20 MG/ML) 5 ML SYRINGE
INTRAMUSCULAR | Status: AC
  Filled 2023-07-31: qty 5

## 2023-07-31 MED ORDER — ORAL CARE MOUTH RINSE: 15.0000 mL | Freq: Once | OROMUCOSAL | Status: AC

## 2023-07-31 MED ORDER — DEXAMETHASONE SODIUM PHOSPHATE 10 MG/ML IJ SOLN
INTRAMUSCULAR | Status: DC | PRN
  Administered 2023-07-31: 10 mg via INTRAVENOUS

## 2023-07-31 MED ORDER — ACETAMINOPHEN 10 MG/ML IV SOLN
INTRAVENOUS | Status: AC
  Filled 2023-07-31: qty 100

## 2023-07-31 MED ORDER — PROPOFOL 10 MG/ML IV BOLUS
INTRAVENOUS | Status: AC
  Filled 2023-07-31: qty 20

## 2023-07-31 MED ORDER — IBUPROFEN 200 MG PO TABS: 600.0000 mg | ORAL_TABLET | Freq: Four times a day (QID) | ORAL | Status: AC | PRN

## 2023-07-31 MED ORDER — EPHEDRINE SULFATE-NACL 50-0.9 MG/10ML-% IV SOSY
PREFILLED_SYRINGE | INTRAVENOUS | Status: DC | PRN
Start: 2023-07-31 — End: 2023-07-31
  Administered 2023-07-31: 2.5 mg via INTRAVENOUS
  Administered 2023-07-31: 5 mg via INTRAVENOUS
  Administered 2023-07-31: 2.5 mg via INTRAVENOUS
  Administered 2023-07-31: 5 mg via INTRAVENOUS

## 2023-07-31 MED ORDER — SCOPOLAMINE 1 MG/3DAYS TD PT72
1.0000 | MEDICATED_PATCH | TRANSDERMAL | Status: DC
  Administered 2023-07-31: 1.5 mg via TRANSDERMAL

## 2023-07-31 MED ORDER — LIDOCAINE 2% (20 MG/ML) 5 ML SYRINGE
INTRAMUSCULAR | Status: DC | PRN
  Administered 2023-07-31: 60 mg via INTRAVENOUS

## 2023-07-31 MED ORDER — CHLORHEXIDINE GLUCONATE 0.12 % MT SOLN
15.0000 mL | Freq: Once | OROMUCOSAL | Status: AC
  Administered 2023-07-31: 15 mL via OROMUCOSAL

## 2023-07-31 MED ORDER — MIDAZOLAM HCL 2 MG/2ML IJ SOLN
INTRAMUSCULAR | Status: AC
  Filled 2023-07-31: qty 2

## 2023-07-31 MED ORDER — DEXAMETHASONE SODIUM PHOSPHATE 10 MG/ML IJ SOLN
INTRAMUSCULAR | Status: AC
  Filled 2023-07-31: qty 1

## 2023-07-31 MED ORDER — POVIDONE-IODINE 10 % EX SWAB: 2.0000 | Freq: Once | CUTANEOUS | Status: DC

## 2023-07-31 MED ORDER — FENTANYL CITRATE (PF) 100 MCG/2ML IJ SOLN: 25.0000 ug | INTRAMUSCULAR | Status: DC | PRN

## 2023-07-31 MED ORDER — ACETAMINOPHEN 10 MG/ML IV SOLN
INTRAVENOUS | Status: DC | PRN
  Administered 2023-07-31: 1000 mg via INTRAVENOUS

## 2023-07-31 MED ORDER — LIDOCAINE HCL (PF) 1 % IJ SOLN
INTRAMUSCULAR | Status: AC
  Filled 2023-07-31: qty 30

## 2023-07-31 MED ORDER — LACTATED RINGERS IV SOLN
INTRAVENOUS | Status: DC
Start: 2023-07-31 — End: 2023-07-31

## 2023-07-31 MED ORDER — HEATING PADS PADS: 1.0000 | MEDICATED_PAD | Status: AC | PRN

## 2023-07-31 SURGICAL SUPPLY — 20 items
CATH ROBINSON RED A/P 16FR (CATHETERS) IMPLANT
FILTER UTR ASPR ASSEMBLY (MISCELLANEOUS) ×1 IMPLANT
GLOVE BIOGEL PI IND STRL 7.0 (GLOVE) ×1 IMPLANT
GLOVE BIOGEL PI IND STRL 7.5 (GLOVE) ×1 IMPLANT
GLOVE NEODERM STER SZ 7 (GLOVE) ×1 IMPLANT
GOWN STRL REUS W/ TWL LRG LVL3 (GOWN DISPOSABLE) ×1 IMPLANT
GOWN STRL REUS W/ TWL XL LVL3 (GOWN DISPOSABLE) ×1 IMPLANT
HOSE CONNECTING 18IN BERKELEY (TUBING) ×1 IMPLANT
KIT BERKELEY 1ST TRI 3/8 NO TR (MISCELLANEOUS) ×1 IMPLANT
KIT BERKELEY 1ST TRIMESTER 3/8 (MISCELLANEOUS) ×1 IMPLANT
NS IRRIG 1000ML POUR BTL (IV SOLUTION) ×1 IMPLANT
PACK VAGINAL MINOR WOMEN LF (CUSTOM PROCEDURE TRAY) ×1 IMPLANT
PAD OB MATERNITY 11 LF (PERSONAL CARE ITEMS) ×1 IMPLANT
SET BERKELEY SUCTION TUBING (SUCTIONS) ×1 IMPLANT
SPIKE FLUID TRANSFER (MISCELLANEOUS) ×1 IMPLANT
TOWEL GREEN STERILE FF (TOWEL DISPOSABLE) ×2 IMPLANT
VACURETTE 10 RIGID CVD (CANNULA) IMPLANT
VACURETTE 7MM CVD STRL WRAP (CANNULA) IMPLANT
VACURETTE 8 RIGID CVD (CANNULA) IMPLANT
VACURETTE 9 RIGID CVD (CANNULA) IMPLANT

## 2023-07-31 NOTE — Discharge Instructions (Addendum)
 We will discuss your surgery once again in detail at your post-op visit in two to four weeks. If you haven't already done so, please call to make your appointment as soon as possible.  Dilation and Curettage or Vacuum Curettage, Care After These instructions give you information on caring for yourself after your procedure. Your doctor may also give you more specific instructions. Call your doctor if you have any problems or questions after your procedure. HOME CARE  Do not drive for 24 hours.  Wait 1 week before doing any activities that wear you out.  Do not stand for a long time.  Limit stair climbing to once or twice a day.  Rest often.  Continue with your usual diet.  Drink enough fluids to keep your pee (urine) clear or pale yellow.  If you have a hard time pooping (constipation), you may:  Take a medicine to help you go poop (laxative) as told by your doctor.  Eat more fruit and bran.  Drink more fluids.  Take showers, not baths, for as long as told by your doctor.  Do not swim or use a hot tub until your doctor says it is okay.  Have someone with you for 1day after the procedure.  Do not douche, use tampons, or have sex (intercourse) until seen by your doctor  Only take medicines as told by your doctor. Do not take aspirin. It can cause bleeding.  Keep all doctor visits. GET HELP IF:  You have cramps or pain not helped by medicine.  You have new pain in the belly (abdomen).  You have a bad smelling fluid coming from your vagina.  You have a rash.  You have problems with any medicine. GET HELP RIGHT AWAY IF:   You start to bleed more than a regular period.  You have a fever.  You have chest pain.  You have trouble breathing.  You feel dizzy or feel like passing out (fainting).  You pass out.  You have pain in the tops of your shoulders.  You have vaginal bleeding with or without clumps of blood (blood clots). MAKE SURE YOU:  Understand  these instructions.  Will watch your condition.  Will get help right away if you are not doing well or get worse. Document Released: 01/22/2008 Document Revised: 04/19/2013 Document Reviewed: 11/11/2012 ExitCare Patient Information 2015 [employer]. This information is not intended to replace advice given to you by your health care provider. Make sure you discuss any questions you have with your health care provider.    Post Anesthesia Home Care Instructions  Activity: Get plenty of rest for the remainder of the day. A responsible individual must stay with you for 24 hours following the procedure.  For the next 24 hours, DO NOT: -Drive a car -Operate machinery -Drink alcoholic beverages -Take any medication unless instructed by your physician -Make any legal decisions or sign important papers.  Meals: Start with liquid foods such as gelatin or soup. Progress to regular foods as tolerated. Avoid greasy, spicy, heavy foods. If nausea and/or vomiting occur, drink only clear liquids until the nausea and/or vomiting subsides. Call your physician if vomiting continues.  Special Instructions/Symptoms: Your throat may feel dry or sore from the anesthesia or the breathing tube placed in your throat during surgery. If this causes discomfort, gargle with warm salt water. The discomfort should disappear within 24 hours.  If you had a scopolamine patch placed behind your ear for the management of post- operative   nausea and/or vomiting:  1. The medication in the patch is effective for 72 hours, after which it should be removed.  Wrap patch in a tissue and discard in the trash. Wash hands thoroughly with soap and water. 2. You may remove the patch earlier than 72 hours if you experience unpleasant side effects which may include dry mouth, dizziness or visual disturbances. 3. Avoid touching the patch. Wash your hands with soap and water after contact with the patch.      

## 2023-07-31 NOTE — H&P (Signed)
 Obstetrics & Gynecology Surgical H&P   Date of Surgery: 07/31/2023   Primary OBGYN: [facility]  Reason for Admission: scheduled suction d&c for incomplete AB  History of Present Illness: [name] is a 33 y.o. [phone] (Patient's last menstrual period was 04/03/2023.), with the above chief complaint. Past medical history is significant for h/o palpitations.  Patient with missed AB at 5wks based on GSD diagnosed on 3/12 and given cytotec. F/u visit and exam on 4/2 showed retained products of conception and patient set up for surgery today. Patient states she has persistent bleeding and has about 8 pads per day of bleeding, non saturating, no clots. She passed a clot prior to clinic and u/s but u/s, as stated above, still had the GS noted  ROS: A 12-point review of systems was performed and negative, except as stated in the above HPI.  OBGYN History: As per HPI. OB History  Gravida Para Term Preterm AB Living  3 2 2  0 0 2  SAB IAB Ectopic Multiple Live Births  0 0 0 0 2    # Outcome Date GA Lbr Len/2nd Weight Sex Type Anes PTL Lv  3 Gravida           2 Term 07/26/15 [redacted]w[redacted]d / 00:23 3586 g M VBAC None  LIV     Birth Comments: WNL   1 Term 07/12/11 [redacted]w[redacted]d 27:30 / 03:54 3714 g M CS-LTranv EPI  LIV     Past Medical History: Past Medical History:  Diagnosis Date   Medical history non-contributory    No pertinent past medical history     Past Surgical History: Past Surgical History:  Procedure Laterality Date   CESAREAN SECTION  07/12/2011   Procedure: CESAREAN SECTION;  Surgeon: Lazaro Arms, MD;  Location: [facility];  Service: Gynecology;  Laterality: N/A;    Family History:  Family History  Problem Relation Age of Onset   Heart disease Father    Stroke Father    Anesthesia problems Neg Hx    Hypotension Neg Hx    Malignant hyperthermia Neg Hx    Pseudochol deficiency Neg Hx    Hearing loss Neg Hx    Asthma Neg Hx    Cancer Neg  Hx    Diabetes Neg Hx    Hypertension Neg Hx     Social History:  Social History   Socioeconomic History   Marital status: Married    Spouse name: Not on file   Number of children: Not on file   Years of education: Not on file   Highest education level: Not on file  Occupational History   Not on file  Tobacco Use   Smoking status: Never   Smokeless tobacco: Never  Vaping Use   Vaping status: Never Used  Substance and Sexual Activity   Alcohol use: No   Drug use: No   Sexual activity: Yes    Birth control/protection: None    Comment: planning for IUD  Other Topics Concern   Not on file  Social History Narrative   Not on file   Social Drivers of Health   Financial Resource Strain: Not on file  Food Insecurity: Not on file  Transportation Needs: Not on file  Physical Activity: Not on file  Stress: Not on file  Social Connections: Not on file  Intimate Partner Violence: Not on file    Allergy: No Known Allergies  Current Outpatient Medications: Current Facility-Administered Medications  Medication Dose  Route Frequency Provider Last Rate Last Admin   doxycycline (VIBRAMYCIN) 200 mg in dextrose 5 % 250 mL IVPB  200 mg Intravenous Once Malo Bing, MD       lactated ringers infusion   Intravenous Continuous Marcene Duos, MD 10 mL/hr at 07/31/23 0838 New Bag at 07/31/23 0838   povidone-iodine 10 % swab 2 Application  2 Application Topical Once Wiseman Bing, MD       scopolamine (TRANSDERM-SCOP) 1 MG/3DAYS 1.5 mg  1 patch Transdermal Q72H Achille Rich, MD   1.5 mg at 07/31/23 1610   scopolamine (TRANSDERM-SCOP) 1 MG/3DAYS              Physical Exam:  Current Vital Signs 24h Vital Sign Ranges  T 98.1 F (36.7 C) Temp  Avg: 98.1 F (36.7 C)  Min: 98.1 F (36.7 C)  Max: 98.1 F (36.7 C)  BP 119/78 BP  Min: 119/78  Max: 119/78  HR 90 Pulse  Avg: 90  Min: 90  Max: 90  RR 16 Resp  Avg: 16  Min: 16  Max: 16  SaO2 100 %   SpO2  Avg: 100 %  Min: 100 %   Max: 100 %       24 Hour I/O Current Shift I/O  Time Ins Outs No intake/output data recorded. No intake/output data recorded.    Body mass index is 25.58 kg/m. General appearance: Well nourished, well developed female in no acute distress.  Cardiovascular: S1, S2 normal, no murmur, rub or gallop, regular rate and rhythm Respiratory:  Clear to auscultation bilateral. Normal respiratory effort Abdomen: positive bowel sounds and no masses, hernias; diffusely non tender to palpation, non distended Neuro/Psych:  Normal mood and affect.  Skin:  Warm and dry.  Extremities: no clubbing, cyanosis, or edema.  Lymphatic:  No inguinal lymphadenopathy.   Laboratory: No new labs O POS  Imaging:  Narrative & Impression  CLINICAL DATA:  Vaginal bleeding   EXAM: OBSTETRIC <14 WK [place] AND TRANSVAGINAL OB US   TECHNIQUE: Both transabdominal and transvaginal ultrasound examinations were performed for complete evaluation of the gestation as well as the maternal uterus, adnexal regions, and pelvic cul-de-sac. Transvaginal technique was performed to assess early pregnancy.   COMPARISON:  None Available.   FINDINGS: Intrauterine gestational sac: Single   Yolk sac:  Not Visualized.   Embryo:  Not Visualized.   Cardiac Activity: Not Visualized.   Heart Rate:   bpm   MSD: 3.8 mm   5 w   1 d   CRL:    mm    w    d                  [place] EDC:   Subchorionic hemorrhage:  None visualized.   Maternal uterus/adnexae: No adnexal mass. Multiple fibroids, the largest anteriorly measuring up to 3 cm. Uterus is retroverted. Small amount of free fluid in the pelvis.   IMPRESSION: Probable early intrauterine gestational sac, but no yolk sac, fetal pole, or cardiac activity yet visualized. Recommend follow-up quantitative B-HCG levels and follow-up US in 14 days to assess viability. This recommendation follows SRU consensus guidelines: Diagnostic Criteria for Nonviable Pregnancy Early in the  First Trimester. Malva Limes Med 2013; [id number]:4540-98.     Electronically Signed   By: Charlett Nose M.D.   On: 06/14/2023 20:18    Assessment: patient stable Plan: D/w her recommend proceeding with suction d&c. Potential for passage of some POCs but given the amount  and persistent bleeding  Cornelia Copa MD Attending Center for [facility])

## 2023-07-31 NOTE — Op Note (Addendum)
 Operative Note   07/31/2023  PRE-OP DIAGNOSIS: missed abortion at five weeks   POST-OP DIAGNOSIS: Same. Stage 2 cervical prolapse  SURGEON: Surgeons and Role:     Bing, MD - Primary  ASSISTANT: None  PROCEDURE:  Suction dilation and curettage  ANESTHESIA: General and paracervical block  ESTIMATED BLOOD LOSS:  DRAINS: per OR note  TOTAL IV FLUIDS: per OR note  SPECIMENS: products of conception to pathology  VTE PROPHYLAXIS: SCDs to the bilateral lower extremities  ANTIBIOTICS: Doxycycline 200mg  IV x 1 pre op  COMPLICATIONS: none  DISPOSITION: PACU - hemodynamically stable.  CONDITION: stable  BLOOD TYPE: O POS. Rhogam given:not applicable  FINDINGS: EGBUS wnl. Cervical prolapse to the introitus with a large patulous cervix. Moderate to large amount of necrotic appearing products of conception were seen, with gritty texture in all four quadrants.  PROCEDURE IN DETAIL:  After informed consent was obtained, the patient was taken to the operating room where anesthesia was obtained without difficulty. The patient was positioned in the dorsal lithotomy position in [place] stirrups. The patient was examined under anesthesia, with the above noted findings.  The bi-valved speculum was placed inside the patient's vagina, and the the anterior lip of the cervix was seen and grasped with the tenaculum.  A paracervical block was achieved with 20mL of 1% lidocaine and easily fit a #8 cannula.  The suction was then calibrated to and connected to the number 8 cannula, which was then introduced with the above noted findings. A gentle curettage was done at the end and yield no products of conception.   The suction was then done one more time to remove any remaining curettage material.   Excellent hemostasis was noted, and all instruments were removed, with excellent hemostasis noted throughout.  She was then taken out of dorsal lithotomy. The patient tolerated the  procedure well.  Sponge, lap and instrument counts were correct x2.  The patient was taken to recovery room in excellent condition.  Cornelia Copa MD Attending Center for [employer] [facility])

## 2023-07-31 NOTE — Transfer of Care (Signed)
 Immediate Anesthesia Transfer of Care Note  Patient: Leslie Jackson  Procedure(s) Performed: DILATION AND EVACUATION (Uterus)  Patient Location: PACU  Anesthesia Type:General  Level of Consciousness: drowsy  Airway & Oxygen Therapy: Patient Spontanous Breathing  Post-op Assessment: Report given to RN, Post -op Vital signs reviewed and stable, and Patient moving all extremities X 4  Post vital signs: Reviewed and stable  Last Vitals:  Vitals Value Taken Time  BP 102/58 07/31/23 1009  Temp    Pulse 75 07/31/23 1011  Resp 13 07/31/23 1011  SpO2 99 % 07/31/23 1011  Vitals shown include unfiled device data.  Last Pain:  Vitals:   07/31/23 0826  TempSrc: Oral  PainSc: 0-No pain      Patients Stated Pain Goal: 5 (07/31/23 0826)  Complications: No notable events documented.

## 2023-07-31 NOTE — Anesthesia Procedure Notes (Signed)
 Procedure Name: LMA Insertion Date/Time: 07/31/2023 9:34 AM  Performed by: Alease Medina, CRNAPre-anesthesia Checklist: Patient identified, Emergency Drugs available, Suction available and Patient being monitored Patient Re-evaluated:Patient Re-evaluated prior to induction Oxygen Delivery Method: Circle system utilized Preoxygenation: Pre-oxygenation with 100% oxygen Induction Type: IV induction Ventilation: Mask ventilation without difficulty LMA: LMA inserted LMA Size: 4.0 Number of attempts: 1 Placement Confirmation: positive ETCO2, breath sounds checked- equal and bilateral and CO2 detector Tube secured with: Tape Dental Injury: Teeth and Oropharynx as per pre-operative assessment

## 2023-07-31 NOTE — Anesthesia Preprocedure Evaluation (Addendum)
 Anesthesia Evaluation  Patient identified by MRN, date of birth, ID band Patient awake    Reviewed: Allergy & Precautions, H&P , NPO status , Patient's Chart, lab work & pertinent test results  Airway Mallampati: II  TM Distance: >3 FB Neck ROM: Full    Dental no notable dental hx. (+) Teeth Intact, Dental Advisory Given   Pulmonary neg pulmonary ROS   Pulmonary exam normal breath sounds clear to auscultation       Cardiovascular negative cardio ROS  Rhythm:Regular Rate:Normal     Neuro/Psych negative neurological ROS  negative psych ROS   GI/Hepatic negative GI ROS, Neg liver ROS,,,  Endo/Other  negative endocrine ROS    Renal/GU negative Renal ROS  negative genitourinary   Musculoskeletal   Abdominal   Peds  Hematology negative hematology ROS (+)   Anesthesia Other Findings   Reproductive/Obstetrics negative OB ROS                             Anesthesia Physical Anesthesia Plan  ASA: 1  Anesthesia Plan: General   Post-op Pain Management: Tylenol PO (pre-op)* and Toradol IV (intra-op)*   Induction: Intravenous  PONV Risk Score and Plan: 4 or greater and Ondansetron, Dexamethasone and Midazolam  Airway Management Planned: LMA  Additional Equipment:   Intra-op Plan:   Post-operative Plan: Extubation in OR  Informed Consent: I have reviewed the patients History and Physical, chart, labs and discussed the procedure including the risks, benefits and alternatives for the proposed anesthesia with the patient or authorized representative who has indicated his/her understanding and acceptance.     Dental advisory given  Plan Discussed with: CRNA  Anesthesia Plan Comments:        Anesthesia Quick Evaluation

## 2023-07-31 NOTE — Anesthesia Postprocedure Evaluation (Signed)
 Anesthesia Post Note  Patient: Leslie Jackson  Procedure(s) Performed: DILATION AND EVACUATION (Uterus)     Patient location during evaluation: PACU Anesthesia Type: General Level of consciousness: awake and alert Pain management: pain level controlled Vital Signs Assessment: post-procedure vital signs reviewed and stable Respiratory status: spontaneous breathing, nonlabored ventilation and respiratory function stable Cardiovascular status: blood pressure returned to baseline and stable Postop Assessment: no apparent nausea or vomiting Anesthetic complications: no  No notable events documented.  Last Vitals:  Vitals:   07/31/23 1045 07/31/23 1100  BP: 102/64 95/68  Pulse: 83 89  Resp:    Temp:  36.5 C  SpO2: 100% 100%    Last Pain:  Vitals:   07/31/23 1100  TempSrc:   PainSc: 0-No pain                 Honore Wipperfurth,W. EDMOND

## 2023-07-31 NOTE — Progress Notes (Signed)
 Pt c/o some "burning pain" above the area of the IV. Pt antibiotics were recently completed. No redness or swelling noted to area pt is complaining of. IV flushes fine. Anesthesia notified and ok with removing IV and d/c pt. Hot pack placed on site. D/C instructions were reviewed with pt and pt husband. Denies any questions about discharge teaching.

## 2023-08-01 ENCOUNTER — Encounter (HOSPITAL_COMMUNITY): Payer: Self-pay | Admitting: Obstetrics and Gynecology

## 2023-08-01 DIAGNOSIS — N812 Incomplete uterovaginal prolapse: Secondary | ICD-10-CM | POA: Diagnosis present

## 2023-08-03 LAB — SURGICAL PATHOLOGY

## 2023-12-07 ENCOUNTER — Other Ambulatory Visit: Payer: No Typology Code available for payment source
